# Patient Record
Sex: Male | Born: 1965
Health system: Southern US, Community
[De-identification: ages and names within clinical notes are randomized; demographics above are authoritative.]

## PROBLEM LIST (undated history)

## (undated) DIAGNOSIS — R109 Unspecified abdominal pain: Secondary | ICD-10-CM

## (undated) HISTORY — PX: OTHER SURGICAL HISTORY: SHX169

---

## 2015-07-02 ENCOUNTER — Encounter (HOSPITAL_COMMUNITY): Payer: Self-pay | Admitting: Emergency Medicine

## 2015-07-02 ENCOUNTER — Emergency Department (HOSPITAL_COMMUNITY): Payer: Self-pay

## 2015-07-02 ENCOUNTER — Emergency Department (HOSPITAL_COMMUNITY)
Admission: EM | Admit: 2015-07-02 | Discharge: 2015-07-02 | Disposition: A | Discharge status: Home / Self Care | Payer: Self-pay | Attending: Emergency Medicine | Admitting: Emergency Medicine

## 2015-07-02 DIAGNOSIS — R1032 Left lower quadrant pain: Secondary | ICD-10-CM | POA: Insufficient documentation

## 2015-07-02 DIAGNOSIS — R1031 Right lower quadrant pain: Secondary | ICD-10-CM | POA: Insufficient documentation

## 2015-07-02 DIAGNOSIS — R109 Unspecified abdominal pain: Secondary | ICD-10-CM

## 2015-07-02 DIAGNOSIS — R5383 Other fatigue: Secondary | ICD-10-CM | POA: Insufficient documentation

## 2015-07-02 DIAGNOSIS — R339 Retention of urine, unspecified: Secondary | ICD-10-CM | POA: Insufficient documentation

## 2015-07-02 DIAGNOSIS — F17200 Nicotine dependence, unspecified, uncomplicated: Secondary | ICD-10-CM | POA: Insufficient documentation

## 2015-07-02 LAB — URINALYSIS, ROUTINE W REFLEX MICROSCOPIC
BILIRUBIN URINE: NEGATIVE
GLUCOSE, UA: NEGATIVE mg/dL
HGB URINE DIPSTICK: NEGATIVE
KETONES UR: NEGATIVE mg/dL
Leukocytes, UA: NEGATIVE
NITRITE: NEGATIVE
PH: 5.5 (ref 5.0–8.0)
Protein, ur: NEGATIVE mg/dL
SPECIFIC GRAVITY, URINE: 1.029 (ref 1.005–1.030)

## 2015-07-02 LAB — COMPREHENSIVE METABOLIC PANEL
ALBUMIN: 3.6 g/dL (ref 3.5–5.0)
ALT: 35 U/L (ref 17–63)
AST: 29 U/L (ref 15–41)
Alkaline Phosphatase: 60 U/L (ref 38–126)
Anion gap: 4 — ABNORMAL LOW (ref 5–15)
BILIRUBIN TOTAL: 0.3 mg/dL (ref 0.3–1.2)
BUN: 11 mg/dL (ref 6–20)
CO2: 24 mmol/L (ref 22–32)
Calcium: 8.6 mg/dL — ABNORMAL LOW (ref 8.9–10.3)
Chloride: 110 mmol/L (ref 101–111)
Creatinine, Ser: 1.23 mg/dL (ref 0.61–1.24)
GFR calc Af Amer: >60 mL/min (ref >60)
GFR calc non Af Amer: >60 mL/min (ref >60)
GLUCOSE: 125 mg/dL — AB (ref 65–99)
POTASSIUM: 4.1 mmol/L (ref 3.5–5.1)
SODIUM: 138 mmol/L (ref 135–145)
TOTAL PROTEIN: 6.9 g/dL (ref 6.5–8.1)

## 2015-07-02 LAB — CBC
HEMATOCRIT: 39.2 % (ref 39.0–52.0)
HEMOGLOBIN: 13.3 g/dL (ref 13.0–17.0)
MCH: 28.9 pg (ref 26.0–34.0)
MCHC: 33.9 g/dL (ref 30.0–36.0)
MCV: 85.2 fL (ref 78.0–100.0)
Platelets: 171 10*3/uL (ref 150–400)
RBC: 4.6 MIL/uL (ref 4.22–5.81)
RDW: 14.6 % (ref 11.5–15.5)
WBC: 6.2 10*3/uL (ref 4.0–10.5)

## 2015-07-02 LAB — TSH: TSH: 0.72 u[IU]/mL (ref 0.350–4.500)

## 2015-07-02 LAB — LIPASE, BLOOD: Lipase: 34 U/L (ref 11–51)

## 2015-07-02 MED ORDER — MORPHINE SULFATE (PF) 4 MG/ML IV SOLN
4.0000 mg | Freq: Once | INTRAVENOUS | Status: DC
  Filled 2015-07-02: qty 1

## 2015-07-02 MED ORDER — NAPROXEN 375 MG PO TABS: 375.0000 mg | ORAL_TABLET | Freq: Two times a day (BID) | ORAL | Status: DC

## 2015-07-02 MED ORDER — ONDANSETRON HCL 4 MG/2ML IJ SOLN
4.0000 mg | Freq: Once | INTRAMUSCULAR | Status: AC
  Administered 2015-07-02: 4 mg via INTRAVENOUS
  Filled 2015-07-02: qty 2

## 2015-07-02 MED ORDER — SODIUM CHLORIDE 0.9 % IV BOLUS (SEPSIS)
1000.0000 mL | Freq: Once | INTRAVENOUS | Status: AC
  Administered 2015-07-02: 1000 mL via INTRAVENOUS

## 2015-07-02 MED ORDER — IOPAMIDOL (ISOVUE-300) INJECTION 61%
INTRAVENOUS | Status: AC
  Administered 2015-07-02: 100 mL
  Filled 2015-07-02: qty 100

## 2015-07-02 NOTE — ED Notes (Signed)
Pt states every since May he has trouble going to the bathroom, states he has the urge to go but cant and sits on the toilet to wait for it to come. Pt states he took cranberry juice and drank a lot of water. Pt states the second week of June he started having abdominal pain and ever since May hes felt weaker and weaker. Pt thought he pulled his muscle in his abdomen but the pain is still there. Pain in bilateral lower quadrants and tender to palpation. Pt states ambulating helps with the pain. Pt is in NAD. AAOX4, ambulatory.

## 2015-07-02 NOTE — ED Provider Notes (Signed)
CSN: [id number]     Arrival date & time 07/02/15  1126 History   First MD Initiated Contact with Patient 07/02/15 1617     Chief Complaint  Patient presents with  . Urinary Retention  . Abdominal Pain  . Fatigue   HPI Pt states every since May he has trouble going to the bathroom, states he has the urge to go but cant and sits on the toilet to wait for it to come. Pt states he took cranberry juice and drank a lot of water. Pt states the second week of June he started having abdominal pain and ever since May hes felt weaker and weaker. Pt thought he pulled his muscle in his abdomen but the pain is still there. Pain in bilateral lower quadrants. Pt states ambulating helps with the pain. No testicular pain. He is  Not taking any painhh meds. Did vomit once yesterday after drinking water. Not drinking much today or eating. 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 No fevers, chills. Has all abdominal organs.  No dysuria, no genital discharge.   History reviewed. No pertinent past medical history. Past Surgical History  Procedure Laterality Date  . Knee surgery     No family history on file. Social History  Substance Use Topics  . Smoking status: Current Some Day Smoker  . Smokeless tobacco: None  . Alcohol Use: Yes    Review of Systems  Constitutional: Negative for fever.  Genitourinary: Negative for dysuria, penile swelling and penile pain.  Allergic/Immunologic: Negative for immunocompromised state.  All other systems reviewed and are negative.     Allergies  Vinegar  Home Medications   Prior to Admission medications   Medication Sig Start Date End Date Taking? Authorizing Provider  naproxen (NAPROSYN) 375 MG tablet Take 1 tablet (375 mg total) by mouth 2 (two) times daily. 07/02/15   Sidney AceAlison Charruf Dracen Reigle, MD   BP 116/84 mmHg  Pulse 61  Temp(Src) 98.2 F (36.8 C) (Oral)  Resp 18  SpO2 100% Physical Exam  Constitutional: He appears  well-developed and well-nourished. No distress.  HENT:  Head: Normocephalic and atraumatic.  Left Ear: External ear normal.  Eyes: Conjunctivae are normal. Pupils are equal, round, and reactive to light. Right eye exhibits no discharge. Left eye exhibits no discharge.  Neck: Normal range of motion. Neck supple.  Cardiovascular: Normal rate and regular rhythm.   No murmur heard. Pulmonary/Chest: Effort normal and breath sounds normal. No respiratory distress.  Abdominal: Soft. Bowel sounds are normal. He exhibits no distension and no mass. There is tenderness (bilateral pelvic and suprapubic). There is no rebound. Guarding: mild, involuntary.  Genitourinary: Penis normal. No penile tenderness.  No inguinal  Hernia, no epidydimal tenderness  Musculoskeletal: He exhibits no edema.  Neurological: He is alert.  Skin: Skin is warm. He is not diaphoretic.  Psychiatric: He has a normal mood and affect.     ED Course  Procedures (including critical care time) Labs Review Labs Reviewed  COMPREHENSIVE METABOLIC PANEL - Abnormal; Notable for the following:    Glucose, Bld 125 (*)    Calcium 8.6 (*)    Anion gap 4 (*)    All other components within normal limits  LIPASE, BLOOD  CBC  URINALYSIS, ROUTINE W REFLEX MICROSCOPIC (NOT AT [facility])  TSH    Imaging Review Ct Abdomen Pelvis W Contrast  07/02/2015  CLINICAL DATA:  Pt states every since May he has trouble going to the  bathroom, states he has the urge to go but cant and sits on the toilet to wait for it to come. Pt states he took cranberry juice and drank a lot of water. Abdominal pain. Weakness. EXAM: CT ABDOMEN AND PELVIS WITH CONTRAST TECHNIQUE: Multidetector CT imaging of the abdomen and pelvis was performed using the standard protocol following bolus administration of intravenous contrast. CONTRAST:  ISOVUE-300 IOPAMIDOL (ISOVUE-300) INJECTION 61% COMPARISON:  None. FINDINGS: Lower chest: Lung bases are clear. Hepatobiliary: No  focal hepatic lesion. No biliary duct dilatation. Gallbladder is normal. Common bile duct is normal. Pancreas: Pancreas is normal. No ductal dilatation. No pancreatic inflammation. Spleen: Normal spleen Adrenals/urinary tract: Adrenal glands and kidneys are normal. The ureters and bladder normal. Stomach/Bowel: Stomach, small bowel, appendix, and cecum are normal. The colon and rectosigmoid colon are normal. Vascular/Lymphatic: Abdominal aorta is normal caliber. There is no retroperitoneal or periportal lymphadenopathy. No pelvic lymphadenopathy. Reproductive: Prostate normal all Other: small umbilical hernia measures 23 by 16 mm (image 52, series 20) Musculoskeletal: No aggressive osseous lesion. IMPRESSION: 1. No acute abdominal or pelvic findings. 2. Small umbilical hernia. Electronically Signed   By: Genevive Bi M.D.   On: 07/02/2015 18:27   I have personally reviewed and evaluated these images and lab results as part of my medical decision-making.   EKG Interpretation None      MDM   Final diagnoses:  Abdominal pain, unspecified abdominal location   Doubt ruptured ulcer as abdomen without peritonitis and denies blood in stool or vomit; no significant lipase elevation to suggest acute pancreatitis; doubt bowel obstruction as no significant constipation/hx of SBO/Crohns or other high risk condition. Doubt ischemic bowel as pain not out of proportion to exam. Labs reviewed and not suggestive of hepatic/biliary emergency, AKI, or critical anemia. Pain is not epigastric and patient is not diabetic or with other risk factors for ACS. Doubt MI and patient also denies chest pain or shortness of breath. US bladder: 16cc post-void, doubt retention.  Given significant pain however, obtained CT abdomen and pelvis.  The CT of the abdomen and pelvis did not reveal any acute abnormality. A small umbilical hernia was noted. Labs were unremarkable and urinalysis did not reveal a urinary tract infection.  Suspect pain may be musculoskeletal but recommend follow-up with primary care provider.  Return for severe worsening pain, intractable vomiting, inability to tolerate PO or other concerning sx. FU with pcp in 2 days. Pt verbalized understanding. Pt discharged in good condition.     Sidney Ace, MD 07/02/15 1931  Cathren Laine, MD 07/02/15 [phone]

## 2015-07-02 NOTE — Discharge Instructions (Signed)
The CT of the abdomen and pelvis did not reveal any acute abnormality. A small umbilical hernia was noted. Labs were unremarkable and urinalysis did not reveal a urinary tract infection. Suspect pain may be musculoskeletal but recommend follow-up with primary care provider.  Abdominal Pain, Adult Many things can cause abdominal pain. Usually, abdominal pain is not caused by a disease and will improve without treatment. It can often be observed and treated at home. Your health care provider will do a physical exam and possibly order blood tests and X-rays to help determine the seriousness of your pain. However, in many cases, more time must pass before a clear cause of the pain can be found. Before that point, your health care provider may not know if you need more testing or further treatment. HOME CARE INSTRUCTIONS Monitor your abdominal pain for any changes. The following actions may help to alleviate any discomfort you are experiencing:  Only take over-the-counter or prescription medicines as directed by your health care provider.  Do not take laxatives unless directed to do so by your health care provider.  Try a clear liquid diet (broth, tea, or water) as directed by your health care provider. Slowly move to a bland diet as tolerated. SEEK MEDICAL CARE IF:  You have unexplained abdominal pain.  You have abdominal pain associated with nausea or diarrhea.  You have pain when you urinate or have a bowel movement.  You experience abdominal pain that wakes you in the night.  You have abdominal pain that is worsened or improved by eating food.  You have abdominal pain that is worsened with eating fatty foods.  You have a fever. SEEK IMMEDIATE MEDICAL CARE IF:  Your pain does not go away within 2 hours.  You keep throwing up (vomiting).  Your pain is felt only in portions of the abdomen, such as the right side or the left lower portion of the abdomen.  You pass bloody or black tarry  stools. MAKE SURE YOU:  Understand these instructions.  Will watch your condition.  Will get help right away if you are not doing well or get worse.   This information is not intended to replace advice given to you by your health care provider. Make sure you discuss any questions you have with your health care provider.   Document Released: 10/08/2004 Document Revised: 09/19/2014 Document Reviewed: 09/07/2012 Elsevier Interactive Patient Education [employer]2016 Elsevier Inc.

## 2015-07-02 NOTE — ED Notes (Signed)
Care assumed at time of d/c.

## 2017-04-05 ENCOUNTER — Ambulatory Visit (HOSPITAL_COMMUNITY)
Admission: EM | Admit: 2017-04-05 | Discharge: 2017-04-05 | Disposition: A | Discharge status: Home / Self Care | Payer: Self-pay | Attending: Family Medicine | Admitting: Family Medicine

## 2017-04-05 ENCOUNTER — Encounter (HOSPITAL_COMMUNITY): Payer: Self-pay | Admitting: Family Medicine

## 2017-04-05 DIAGNOSIS — I10 Essential (primary) hypertension: Secondary | ICD-10-CM

## 2017-04-05 NOTE — ED Triage Notes (Signed)
Pt here for episode of work today . He said he was stressed and BP got up to 159/103. Denies passing out or dizziness. Denies chest pain or SOB.

## 2017-04-06 NOTE — ED Provider Notes (Signed)
[facility]   [id number] 04/05/17 Arrival Time: 1133  ASSESSMENT & PLAN:  1. Hypertension, unspecified type    Plans to purchase a BP cuff so that he may begin checking regularly. Plans to est care with PCP. Recommend that he f/u here in a few weeks with BP log and we can discuss.  Will follow up with PCP or here if worsening or failing to improve as anticipated. Reviewed expectations re: course of current medical issues. Questions answered. Outlined signs and symptoms indicating need for more acute intervention. Patient verbalized understanding. After Visit Summary given.   SUBJECTIVE:  Tommas OlpBrian Hruska is a 52 y.o. male who presents with concerns regarding increased blood pressures. He has not been treated for HTN in the past. BP at work today was 159/103. Does not check regularly. 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 Denies symptoms of chest pain, palpations, orthopnea, nocturnal dyspnea, or LE edema.  FH of HTN.  Social History   Tobacco Use  Smoking Status Current Some Day Smoker   ROS: As per HPI.   OBJECTIVE:  Vitals:   04/05/17 1251  BP: (!) 154/96  Pulse: 65  Resp: 18  Temp: 97.8 F (36.6 C)  TempSrc: Oral  SpO2: 100%    General appearance: alert; no distress Eyes: PERRLA; EOMI HENT: normocephalic; atraumatic Neck: supple Lungs: clear to auscultation bilaterally Heart: regular rate and rhythm without murmer Abdomen: soft, non-tender Extremities: no edema; symmetrical with no gross deformities Skin: warm and dry Psychological: alert and cooperative; normal mood and affect  Allergies  Allergen Reactions  . Vinegar [Acetic Acid]     Social History   Socioeconomic History  . Marital status: Single    Spouse name: Not on file  . Number of children: Not on file  . Years of education: Not on file  . Highest education level: Not on file  Occupational History  . Not on file  Social Needs  . Financial  resource strain: Not on file  . Food insecurity:    Worry: Not on file    Inability: Not on file  . Transportation needs:    Medical: Not on file    Non-medical: Not on file  Tobacco Use  . Smoking status: Current Some Day Smoker  Substance and Sexual Activity  . Alcohol use: Yes  . Drug use: Not on file  . Sexual activity: Not on file  Lifestyle  . Physical activity:    Days per week: Not on file    Minutes per session: Not on file  . Stress: Not on file  Relationships  . Social connections:    Talks on phone: Not on file    Gets together: Not on file    Attends religious service: Not on file    Active member of club or organization: Not on file    Attends meetings of clubs or organizations: Not on file    Relationship status: Not on file  . Intimate partner violence:    Fear of current or ex partner: Not on file    Emotionally abused: Not on file    Physically abused: Not on file    Forced sexual activity: Not on file  Other Topics Concern  . Not on file  Social History Narrative  . Not on file    Past Surgical History:  Procedure Laterality Date  . knee surgery        Mardella LaymanHagler, Nhat, MD 04/06/17 1013

## 2017-10-09 ENCOUNTER — Inpatient Hospital Stay (HOSPITAL_COMMUNITY): Payer: Self-pay

## 2017-10-09 ENCOUNTER — Emergency Department (HOSPITAL_COMMUNITY): Payer: Self-pay

## 2017-10-09 ENCOUNTER — Inpatient Hospital Stay (HOSPITAL_COMMUNITY)
Admission: EM | Admit: 2017-10-09 | Discharge: 2017-10-12 | DRG: 872 | Disposition: A | Discharge status: Home / Self Care | Payer: Self-pay | Attending: Internal Medicine | Admitting: Internal Medicine

## 2017-10-09 ENCOUNTER — Encounter (HOSPITAL_COMMUNITY): Payer: Self-pay | Admitting: Emergency Medicine

## 2017-10-09 ENCOUNTER — Other Ambulatory Visit: Payer: Self-pay

## 2017-10-09 DIAGNOSIS — E860 Dehydration: Secondary | ICD-10-CM | POA: Diagnosis present

## 2017-10-09 DIAGNOSIS — N410 Acute prostatitis: Secondary | ICD-10-CM

## 2017-10-09 DIAGNOSIS — N289 Disorder of kidney and ureter, unspecified: Secondary | ICD-10-CM

## 2017-10-09 DIAGNOSIS — N419 Inflammatory disease of prostate, unspecified: Secondary | ICD-10-CM | POA: Diagnosis present

## 2017-10-09 DIAGNOSIS — E871 Hypo-osmolality and hyponatremia: Secondary | ICD-10-CM | POA: Diagnosis present

## 2017-10-09 DIAGNOSIS — N12 Tubulo-interstitial nephritis, not specified as acute or chronic: Secondary | ICD-10-CM | POA: Diagnosis present

## 2017-10-09 DIAGNOSIS — I452 Bifascicular block: Secondary | ICD-10-CM | POA: Diagnosis present

## 2017-10-09 DIAGNOSIS — N179 Acute kidney failure, unspecified: Secondary | ICD-10-CM | POA: Diagnosis present

## 2017-10-09 DIAGNOSIS — R109 Unspecified abdominal pain: Secondary | ICD-10-CM

## 2017-10-09 DIAGNOSIS — E861 Hypovolemia: Secondary | ICD-10-CM | POA: Diagnosis present

## 2017-10-09 DIAGNOSIS — A419 Sepsis, unspecified organism: Principal | ICD-10-CM | POA: Diagnosis present

## 2017-10-09 HISTORY — DX: Unspecified abdominal pain: R10.9

## 2017-10-09 LAB — COMPREHENSIVE METABOLIC PANEL
ALK PHOS: 70 U/L (ref 38–126)
ALT: 31 U/L (ref 0–44)
AST: 21 U/L (ref 15–41)
Albumin: 3.7 g/dL (ref 3.5–5.0)
Anion gap: 9 (ref 5–15)
BUN: 11 mg/dL (ref 6–20)
CHLORIDE: 104 mmol/L (ref 98–111)
CO2: 20 mmol/L — AB (ref 22–32)
CREATININE: 1.37 mg/dL — AB (ref 0.61–1.24)
Calcium: 8.7 mg/dL — ABNORMAL LOW (ref 8.9–10.3)
GFR calc Af Amer: >60 mL/min (ref >60)
GFR calc non Af Amer: 58 mL/min — ABNORMAL LOW (ref >60)
Glucose, Bld: 98 mg/dL (ref 70–99)
Potassium: 3.9 mmol/L (ref 3.5–5.1)
SODIUM: 133 mmol/L — AB (ref 135–145)
Total Bilirubin: 1.4 mg/dL — ABNORMAL HIGH (ref 0.3–1.2)
Total Protein: 7.6 g/dL (ref 6.5–8.1)

## 2017-10-09 LAB — URINALYSIS, ROUTINE W REFLEX MICROSCOPIC
Bacteria, UA: NONE SEEN
Bilirubin Urine: NEGATIVE
Glucose, UA: NEGATIVE mg/dL
Hgb urine dipstick: NEGATIVE
KETONES UR: 20 mg/dL — AB
Nitrite: NEGATIVE
Protein, ur: NEGATIVE mg/dL
SPECIFIC GRAVITY, URINE: 1.036 — AB (ref 1.005–1.030)
pH: 6 (ref 5.0–8.0)

## 2017-10-09 LAB — CBC
HCT: 40.2 % (ref 39.0–52.0)
Hemoglobin: 13.9 g/dL (ref 13.0–17.0)
MCH: 29.7 pg (ref 26.0–34.0)
MCHC: 34.6 g/dL (ref 30.0–36.0)
MCV: 85.9 fL (ref 78.0–100.0)
PLATELETS: 167 10*3/uL (ref 150–400)
RBC: 4.68 MIL/uL (ref 4.22–5.81)
RDW: 13.6 % (ref 11.5–15.5)
WBC: 10.7 10*3/uL — ABNORMAL HIGH (ref 4.0–10.5)

## 2017-10-09 LAB — LIPASE, BLOOD: LIPASE: 28 U/L (ref 11–51)

## 2017-10-09 LAB — I-STAT CG4 LACTIC ACID, ED: LACTIC ACID, VENOUS: 0.75 mmol/L (ref 0.5–1.9)

## 2017-10-09 MED ORDER — ACETAMINOPHEN 325 MG PO TABS: 650.0000 mg | ORAL_TABLET | Freq: Four times a day (QID) | ORAL | Status: DC | PRN

## 2017-10-09 MED ORDER — MORPHINE SULFATE (PF) 4 MG/ML IV SOLN
4.0000 mg | Freq: Once | INTRAVENOUS | Status: DC
  Filled 2017-10-09: qty 1

## 2017-10-09 MED ORDER — CIPROFLOXACIN IN D5W 400 MG/200ML IV SOLN
400.0000 mg | Freq: Once | INTRAVENOUS | Status: AC
  Administered 2017-10-10: 400 mg via INTRAVENOUS
  Filled 2017-10-09: qty 200

## 2017-10-09 MED ORDER — OXYCODONE HCL 5 MG PO TABS
5.0000 mg | ORAL_TABLET | Freq: Once | ORAL | Status: AC
  Administered 2017-10-09: 5 mg via ORAL
  Filled 2017-10-09: qty 1

## 2017-10-09 MED ORDER — SODIUM CHLORIDE 0.9 % IV SOLN: 1.0000 g | INTRAVENOUS | Status: DC

## 2017-10-09 MED ORDER — CIPROFLOXACIN IN D5W 400 MG/200ML IV SOLN: 400.0000 mg | Freq: Once | INTRAVENOUS | Status: DC

## 2017-10-09 MED ORDER — ONDANSETRON HCL 4 MG/2ML IJ SOLN: 4.0000 mg | Freq: Four times a day (QID) | INTRAMUSCULAR | Status: DC | PRN

## 2017-10-09 MED ORDER — CEFTRIAXONE SODIUM 250 MG IJ SOLR: 250.0000 mg | Freq: Once | INTRAMUSCULAR | Status: DC

## 2017-10-09 MED ORDER — ENOXAPARIN SODIUM 40 MG/0.4ML ~~LOC~~ SOLN
40.0000 mg | SUBCUTANEOUS | Status: DC
  Filled 2017-10-09 (×4): qty 0.4

## 2017-10-09 MED ORDER — AZITHROMYCIN 500 MG PO TABS
1000.0000 mg | ORAL_TABLET | Freq: Once | ORAL | Status: AC
  Administered 2017-10-09: 1000 mg via ORAL
  Filled 2017-10-09: qty 2

## 2017-10-09 MED ORDER — SODIUM CHLORIDE 0.9 % IV SOLN
INTRAVENOUS | Status: DC
  Administered 2017-10-09: 22:00:00 via INTRAVENOUS

## 2017-10-09 MED ORDER — SODIUM CHLORIDE 0.9 % IV BOLUS
1000.0000 mL | Freq: Once | INTRAVENOUS | Status: AC
  Administered 2017-10-09: 1000 mL via INTRAVENOUS

## 2017-10-09 MED ORDER — SODIUM CHLORIDE 0.9 % IV SOLN
INTRAVENOUS | Status: AC
  Administered 2017-10-09: 22:00:00 via INTRAVENOUS

## 2017-10-09 MED ORDER — ONDANSETRON HCL 4 MG PO TABS
4.0000 mg | ORAL_TABLET | Freq: Four times a day (QID) | ORAL | Status: DC | PRN
  Administered 2017-10-10 – 2017-10-11 (×2): 4 mg via ORAL
  Filled 2017-10-09 (×2): qty 1

## 2017-10-09 MED ORDER — IOHEXOL 300 MG/ML  SOLN
100.0000 mL | Freq: Once | INTRAMUSCULAR | Status: AC | PRN
  Administered 2017-10-09: 100 mL via INTRAVENOUS

## 2017-10-09 MED ORDER — SODIUM CHLORIDE 0.9 % IV SOLN
1.0000 g | Freq: Once | INTRAVENOUS | Status: AC
  Administered 2017-10-09: 1 g via INTRAVENOUS
  Filled 2017-10-09: qty 10

## 2017-10-09 MED ORDER — CIPROFLOXACIN IN D5W 400 MG/200ML IV SOLN
400.0000 mg | Freq: Two times a day (BID) | INTRAVENOUS | Status: DC
  Administered 2017-10-10 – 2017-10-11 (×3): 400 mg via INTRAVENOUS
  Filled 2017-10-09 (×3): qty 200

## 2017-10-09 MED ORDER — ACETAMINOPHEN 325 MG PO TABS
650.0000 mg | ORAL_TABLET | Freq: Once | ORAL | Status: AC | PRN
  Administered 2017-10-09: 650 mg via ORAL
  Filled 2017-10-09: qty 2

## 2017-10-09 MED ORDER — ACETAMINOPHEN 325 MG PO TABS
650.0000 mg | ORAL_TABLET | Freq: Once | ORAL | Status: AC
  Administered 2017-10-09: 650 mg via ORAL
  Filled 2017-10-09: qty 2

## 2017-10-09 MED ORDER — HYDROCODONE-ACETAMINOPHEN 5-325 MG PO TABS
1.0000 | ORAL_TABLET | ORAL | Status: DC | PRN
  Administered 2017-10-10 – 2017-10-11 (×5): 2 via ORAL
  Filled 2017-10-09 (×6): qty 2

## 2017-10-09 MED ORDER — SODIUM CHLORIDE 0.9% FLUSH
3.0000 mL | Freq: Two times a day (BID) | INTRAVENOUS | Status: DC
  Administered 2017-10-10 – 2017-10-11 (×2): 3 mL via INTRAVENOUS

## 2017-10-09 MED ORDER — LACTATED RINGERS IV SOLN: INTRAVENOUS | Status: DC

## 2017-10-09 MED ORDER — ONDANSETRON HCL 4 MG/2ML IJ SOLN
4.0000 mg | Freq: Once | INTRAMUSCULAR | Status: AC
  Administered 2017-10-09: 4 mg via INTRAVENOUS
  Filled 2017-10-09: qty 2

## 2017-10-09 MED ORDER — ACETAMINOPHEN 650 MG RE SUPP: 650.0000 mg | Freq: Four times a day (QID) | RECTAL | Status: DC | PRN

## 2017-10-09 MED ORDER — SENNOSIDES-DOCUSATE SODIUM 8.6-50 MG PO TABS
1.0000 | ORAL_TABLET | Freq: Every evening | ORAL | Status: DC | PRN
  Administered 2017-10-11: 1 via ORAL
  Filled 2017-10-09: qty 1

## 2017-10-09 NOTE — ED Notes (Signed)
2 RNs assessed potiential sites for blood cultures, unable to find. Phelbotomist attempted stick unsucessfully.

## 2017-10-09 NOTE — ED Provider Notes (Signed)
Physical Exam  BP (!) 146/90 (BP Location: Right Arm)   Pulse 81   Temp (!) 101.8 F (38.8 C) (Oral)   Resp (!) 32   SpO2 100%   Assumed care from [facility], PA-C at 1600. Briefly, the patient is a 52 y.o. male with PMHx of  has no past medical history on file. here with lower abdominal pain.  Patient reports that for the past 24 hours, he began having increasing lower abdominal pain in the suprapubic region, and also in a bandlike pattern.  Patient is also reported increased urinary frequency, going at least once an hour.  Patient denies dysuria, penile discharge, testicular pain or swelling, or testicular erythema.  Patient denies any history of urinary tract infections or recent urologic procedures.  Patient does report that he had a sexually transmitted infection from his current partner in patient was treated at that time, denies any other sexual partners in the interim.  Labs Reviewed  COMPREHENSIVE METABOLIC PANEL - Abnormal; Notable for the following components:      Result Value   Sodium 133 (*)    CO2 20 (*)    Creatinine, Ser 1.37 (*)    Calcium 8.7 (*)    Total Bilirubin 1.4 (*)    GFR calc non Af Amer 58 (*)    All other components within normal limits  CBC - Abnormal; Notable for the following components:   WBC 10.7 (*)    All other components within normal limits  URINALYSIS, ROUTINE W REFLEX MICROSCOPIC - Abnormal; Notable for the following components:   Specific Gravity, Urine 1.036 (*)    Ketones, ur 20 (*)    Leukocytes, UA MODERATE (*)    All other components within normal limits  URINE CULTURE  CULTURE, BLOOD (ROUTINE X 2)  CULTURE, BLOOD (ROUTINE X 2)  LIPASE, BLOOD  HIV ANTIBODY (ROUTINE TESTING W REFLEX)  RPR  GC/CHLAMYDIA PROBE AMP ([place]) NOT AT [facility]    Course of Care:   Physical Exam  Constitutional: He appears well-developed and well-nourished. No distress.  HENT:  Head: Normocephalic and atraumatic.  Mouth/Throat: Oropharynx is clear  and moist.  Eyes: Pupils are equal, round, and reactive to light. Conjunctivae and EOM are normal.  Neck: Normal range of motion. Neck supple.  Cardiovascular: Normal rate, regular rhythm, S1 normal and S2 normal.  No murmur heard. Pulmonary/Chest: Effort normal and breath sounds normal. He has no wheezes. He has no rales.  Abdominal: Soft. He exhibits no distension. There is tenderness in the suprapubic area. There is no guarding.  Genitourinary:  Genitourinary Comments: Prostate exam performed with nurse tech chaperone present.  Patient has normal rectal tone.  Prostate was minimally palpated due to concern for prostatitis, patient was tender on palpation of the prostate.   Musculoskeletal: Normal range of motion. He exhibits no edema or deformity.  Lymphadenopathy:    He has no cervical adenopathy.  Neurological: He is alert.  Cranial nerves grossly intact. Patient moves extremities symmetrically and with good coordination.  Skin: Skin is warm and dry. No rash noted. No erythema.  Psychiatric: He has a normal mood and affect. His behavior is normal. Judgment and thought content normal.  Nursing note and vitals reviewed.   ED Course/Procedures   Clinical Course as of Oct 10 2343  Sat Oct 09, 2017  1552 Getting fluids.   Creatinine(!): 1.37 [AM]  1753 Spoke with pharmacist, who recommended ciprofloxacin and IM ceftriaxone.  This is to cover both Gc, chlamydia,  and urinary pathogens.   [AM]  1837 Spoke with [facility], who will admit patient. Appreciate his involvement in the care of this patient.    [AM]    Clinical Course User Index [AM] Elisha Ponder, PA-C    Procedures  MDM   Patient is nontoxic-appearing, but febrile, 101.8.  Differential diagnosis includes complete urinary tract infection versus prostatitis.  Given the fever and no other localizing symptoms for infection, I do feel that this is coming from urinary source, and patient was tender on palpation of  the prostate.  Blood cultures, GC/CT, urine culture, and lactic ordered.  CT abdomen pelvis is without acute abnormality.  Antibiotic started per pharmacist recommendation for coverage for GC/CT, and urinary pathogens.     Elisha Ponder, PA-C 10/09/17 2345    Margarita Grizzle, MD 10/11/17 [phone]

## 2017-10-09 NOTE — ED Notes (Signed)
Pt aware we need a urine specimen. 

## 2017-10-09 NOTE — ED Notes (Signed)
Pt. Stated he is unable to provide sample at this time. Pt. Stated that he just had some water so may be able to provide sample soon.

## 2017-10-09 NOTE — ED Notes (Signed)
Per dr Antionette Char may start antibiotics with one set of cultures

## 2017-10-09 NOTE — ED Notes (Signed)
When verifying that pt aware that urine specimen was needed, noticed patient shaking, obtained oral T (103.21F), triage RN made aware, pt pulled to A-hallway

## 2017-10-09 NOTE — ED Triage Notes (Signed)
Pt reports abd pain and nausea that began yesterday, also reports body aches and chills.

## 2017-10-09 NOTE — ED Provider Notes (Signed)
[facility] EMERGENCY DEPARTMENT Provider Note   CSN: [id number] Arrival date & time: 10/09/17  1147     History   Chief Complaint Chief Complaint  Patient presents with  . Abdominal Pain    HPI Gabriel Vaughn is a 52 y.o. male.  HPI Patient presents to the emergency department with fever, lower abdominal pain, nausea, body aches and chills.  The patient states this began yesterday afternoon.  Patient states that nothing seems to make the condition better or worse.  The patient states he did not take any medication prior to arrival.  Patient states that he had no cough no nasal congestion, no sore throat no or neck stiffness.  The patient states he has had no headache.  Patient states that he is not been around anyone else that is sick.  The patient denies chest pain, shortness of breath, headache,blurred vision, neck pain, fever, cough, weakness, numbness, dizziness, anorexia, edema, vomiting, diarrhea, rash, back pain, dysuria, hematemesis, bloody stool, near syncope, or syncope.  There are no active problems to display for this patient.   Past Surgical History:  Procedure Laterality Date  . knee surgery          Home Medications    Prior to Admission medications   Medication Sig Start Date End Date Taking? Authorizing Provider  naproxen (NAPROSYN) 375 MG tablet Take 1 tablet (375 mg total) by mouth 2 (two) times daily. 07/02/15   Sidney Ace, MD    Family History No family history on file.  Social History Social History   Tobacco Use  . Smoking status: Current Some Day Smoker  Substance Use Topics  . Alcohol use: Yes  . Drug use: Not on file     Allergies   Vinegar [acetic acid]   Review of Systems Review of Systems All other systems negative except as documented in the HPI. All pertinent positives and negatives as reviewed in the HPI. Physical Exam Updated Vital Signs BP 135/84 (BP Location: Right Arm)   Pulse 89   Temp (!)  103.1 F (39.5 C) (Oral)   Resp 18   SpO2 100%   Physical Exam  Constitutional: He is oriented to person, place, and time. He appears well-developed and well-nourished. No distress.  HENT:  Head: Normocephalic and atraumatic.  Mouth/Throat: Oropharynx is clear and moist.  Eyes: Pupils are equal, round, and reactive to light.  Neck: Normal range of motion. Neck supple.  Cardiovascular: Normal rate, regular rhythm and normal heart sounds. Exam reveals no gallop and no friction rub.  No murmur heard. Pulmonary/Chest: Effort normal and breath sounds normal. No respiratory distress. He has no wheezes.  Abdominal: Soft. Bowel sounds are normal. He exhibits no distension. There is tenderness in the right lower quadrant, suprapubic area and left lower quadrant. There is no rigidity, no rebound, no guarding, no CVA tenderness, no tenderness at McBurney's point and negative Murphy's sign. No hernia.    Neurological: He is alert and oriented to person, place, and time. He exhibits normal muscle tone. Coordination normal.  Skin: Skin is warm and dry. Capillary refill takes less than 2 seconds. No rash noted. No erythema.  Psychiatric: He has a normal mood and affect. His behavior is normal.  Nursing note and vitals reviewed.    ED Treatments / Results  Labs (all labs ordered are listed, but only abnormal results are displayed) Labs Reviewed  COMPREHENSIVE METABOLIC PANEL - Abnormal; Notable for the following components:  Result Value   Sodium 133 (*)    CO2 20 (*)    Creatinine, Ser 1.37 (*)    Calcium 8.7 (*)    Total Bilirubin 1.4 (*)    GFR calc non Af Amer 58 (*)    All other components within normal limits  CBC - Abnormal; Notable for the following components:   WBC 10.7 (*)    All other components within normal limits  LIPASE, BLOOD  URINALYSIS, ROUTINE W REFLEX MICROSCOPIC    EKG None  Radiology No results found.  Procedures Procedures (including critical care  time)  Medications Ordered in ED Medications  sodium chloride 0.9 % bolus 1,000 mL (1,000 mLs Intravenous New Bag/Given 10/09/17 1446)  morphine 4 MG/ML injection 4 mg (4 mg Intravenous Refused 10/09/17 1446)  acetaminophen (TYLENOL) tablet 650 mg (650 mg Oral Given 10/09/17 1350)  ondansetron (ZOFRAN) injection 4 mg (4 mg Intravenous Given 10/09/17 1446)  iohexol (OMNIPAQUE) 300 MG/ML solution 100 mL (100 mLs Intravenous Contrast Given 10/09/17 1514)     Initial Impression / Assessment and Plan / ED Course  I have reviewed the triage vital signs and the nursing notes.  Pertinent labs & imaging results that were available during my care of the patient were reviewed by me and considered in my medical decision making (see chart for details).     Patient will be given IV fluids along with anti-medics and pain control.  He is also to have a CT scan to further evaluate his abdominal discomfort.  Patient is advised the plan thus far and all questions were answered.  Final Clinical Impressions(s) / ED Diagnoses   Final diagnoses:  None    ED Discharge Orders    None       Charlestine Night, PA-C 10/09/17 1545    Margarita Grizzle, MD 10/11/17 [phone]

## 2017-10-09 NOTE — H&P (Signed)
History and Physical    Gabriel Vaughn WUJ:[id number] DOB: 01/23/1965 DOA: 10/09/2017  PCP: Patient, No Pcp Per   Patient coming from: Home   Chief Complaint: Chills, lower abdominal pain, nausea  HPI: Gabriel Vaughn is a 52 y.o. male who denies any significant past medical history, though does not see a physician regularly, now presenting to the emergency department with lower abdominal pain, nausea, general malaise, and chills.  Patient reports that the symptoms developed over the past day or so and have steadily worsened.  He reports dull aching pain to the suprapubic region/lower abdomen, localized, severe at times, and associated with nausea, chills, and malaise.  He denies any shortness of breath, cough, vomiting, diarrhea, or rash.  He notes that he may been exposed to STD.  ED Course: Upon arrival to the ED, patient is found to be febrile to 39.5 C, and with vitals otherwise stable.  EKG features a sinus or ectopic atrial rhythm with incomplete RBBB and LAFB.  Chest x-ray is negative for acute cardiopulmonary disease.  CT the abdomen and pelvis is negative for any significant abnormality.  He reportedly had a tender prostate on exam in the ED.  Chemistry panel is notable for mild hyponatremia and creatinine 1.37, up from 1.23 in 2017.  CBC is notable for mild leukocytosis.  Blood and urine cultures were collected, GC, chlamydia, and HIV testing was sent, and the patient was treated with Rocephin, ciprofloxacin, 2 L normal saline, acetaminophen, and oxycodone in the ED.  He remains hemodynamically stable and will be admitted for ongoing evaluation and management of sepsis suspected secondary to prostatitis.  Review of Systems:  All other systems reviewed and apart from HPI, are negative.  History reviewed. No pertinent past medical history.  Past Surgical History:  Procedure Laterality Date  . knee surgery       reports that he has been smoking. He does not have any smokeless tobacco  history on file. He reports that he drinks alcohol. His drug history is not on file.  Allergies  Allergen Reactions  . Vinegar [Acetic Acid] Swelling    Swelling where contact is made    History reviewed. No pertinent family history.   Prior to Admission medications   Medication Sig Start Date End Date Taking? Authorizing Provider  naproxen (NAPROSYN) 375 MG tablet Take 1 tablet (375 mg total) by mouth 2 (two) times daily. Patient not taking: Reported on 10/09/2017 07/02/15   Sidney Ace, MD    Physical Exam: Vitals:   10/09/17 1742 10/09/17 1800 10/09/17 1951 10/09/17 2025  BP: (!) 146/90 (!) 148/93    Pulse: 81 80    Resp: (!) 32 (!) 24    Temp: (!) 101.8 F (38.8 C)  (!) 101.8 F (38.8 C) (!) 100.9 F (38.3 C)  TempSrc: Oral  Oral Oral  SpO2: 100% 99%       Constitutional: NAD, in apparent discomfort Eyes: PERTLA, lids and conjunctivae normal ENMT: Mucous membranes are moist. Posterior pharynx clear of any exudate or lesions.   Neck: normal, supple, no masses, no thyromegaly Respiratory: clear to auscultation bilaterally, no wheezing, no crackles. Normal respiratory effort.    Cardiovascular: S1 & S2 heard, regular rate and rhythm. No extremity edema.  Abdomen: No distension, soft, suprapubic tenderness. Bowel sounds normal.  Musculoskeletal: no clubbing / cyanosis. No joint deformity upper and lower extremities.    Skin: no significant rashes, lesions, ulcers. Warm, dry, well-perfused. Neurologic: CN 2-12 grossly intact. Sensation intact. Strength 5/5  in all 4 limbs.  Psychiatric: Alert and oriented x 3. Calm, cooperative.    Labs on Admission: I have personally reviewed following labs and imaging studies  CBC: Recent Labs  Lab 10/09/17 1241  WBC 10.7*  HGB 13.9  HCT 40.2  MCV 85.9  PLT 167   Basic Metabolic Panel: Recent Labs  Lab 10/09/17 1241  NA 133*  K 3.9  CL 104  CO2 20*  GLUCOSE 98  BUN 11  CREATININE 1.37*  CALCIUM 8.7*    GFR: CrCl cannot be calculated (Unknown ideal weight.). Liver Function Tests: Recent Labs  Lab 10/09/17 1241  AST 21  ALT 31  ALKPHOS 70  BILITOT 1.4*  PROT 7.6  ALBUMIN 3.7   Recent Labs  Lab 10/09/17 1241  LIPASE 28   No results for input(s): AMMONIA in the last 168 hours. Coagulation Profile: No results for input(s): INR, PROTIME in the last 168 hours. Cardiac Enzymes: No results for input(s): CKTOTAL, CKMB, CKMBINDEX, TROPONINI in the last 168 hours. BNP (last 3 results) No results for input(s): PROBNP in the last 8760 hours. HbA1C: No results for input(s): HGBA1C in the last 72 hours. CBG: No results for input(s): GLUCAP in the last 168 hours. Lipid Profile: No results for input(s): CHOL, HDL, LDLCALC, TRIG, CHOLHDL, LDLDIRECT in the last 72 hours. Thyroid Function Tests: No results for input(s): TSH, T4TOTAL, FREET4, T3FREE, THYROIDAB in the last 72 hours. Anemia Panel: No results for input(s): VITAMINB12, FOLATE, FERRITIN, TIBC, IRON, RETICCTPCT in the last 72 hours. Urine analysis:    Component Value Date/Time   COLORURINE YELLOW 10/09/2017 1608   APPEARANCEUR CLEAR 10/09/2017 1608   LABSPEC 1.036 (H) 10/09/2017 1608   PHURINE 6.0 10/09/2017 1608   GLUCOSEU NEGATIVE 10/09/2017 1608   HGBUR NEGATIVE 10/09/2017 1608   BILIRUBINUR NEGATIVE 10/09/2017 1608   KETONESUR 20 (A) 10/09/2017 1608   PROTEINUR NEGATIVE 10/09/2017 1608   NITRITE NEGATIVE 10/09/2017 1608   LEUKOCYTESUR MODERATE (A) 10/09/2017 1608   Sepsis Labs: @LABRCNTIP (procalcitonin:4,lacticidven:4) )No results found for this or any previous visit (from the past 240 hour(s)).   Radiological Exams on Admission: Dg Chest 2 View  Result Date: 10/09/2017 CLINICAL DATA:  Fever and body aches. EXAM: CHEST - 2 VIEW COMPARISON:  None. FINDINGS: The heart size is borderline. The hila, mediastinum, lungs, and pleura are otherwise unremarkable. IMPRESSION: No active cardiopulmonary disease.  Electronically Signed   By: Gerome Sam III M.D   On: 10/09/2017 19:35   Ct Abdomen Pelvis W Contrast  Result Date: 10/09/2017 CLINICAL DATA:  Acute generalized abdominal pain. EXAM: CT ABDOMEN AND PELVIS WITH CONTRAST TECHNIQUE: Multidetector CT imaging of the abdomen and pelvis was performed using the standard protocol following bolus administration of intravenous contrast. CONTRAST:  OMNIPAQUE IOHEXOL 300 MG/ML  SOLN COMPARISON:  CT scan of July 02, 2015. FINDINGS: Lower chest: Minimal left posterior basilar subsegmental atelectasis is noted. Hepatobiliary: No focal liver abnormality is seen. No gallstones, gallbladder wall thickening, or biliary dilatation. Pancreas: Unremarkable. No pancreatic ductal dilatation or surrounding inflammatory changes. Spleen: Normal in size without focal abnormality. Adrenals/Urinary Tract: Adrenal glands are unremarkable. Kidneys are normal, without renal calculi, focal lesion, or hydronephrosis. Bladder is unremarkable. Stomach/Bowel: Stomach is within normal limits. Appendix appears normal. No evidence of bowel wall thickening, distention, or inflammatory changes. Vascular/Lymphatic: No significant vascular findings are present. No enlarged abdominal or pelvic lymph nodes. Reproductive: Prostate is unremarkable. Other: Small fat containing periumbilical hernia is noted. No abnormal fluid collection is noted. Musculoskeletal:  No acute or significant osseous findings. IMPRESSION: No significant abnormality seen in the abdomen or pelvis. Electronically Signed   By: Lupita Raider, M.D.   On: 10/09/2017 16:02    EKG: Independently reviewed. Sinus or ectopic atrial rhythm, RBBB, LAFB.   Assessment/Plan   1. Sepsis suspected secondary to prostatitis  - Presents with lower abdominal pain, nausea, and chills  - Found to have fever, leukocytosis, neg CXR, and tender prostate on exam in ED  - Blood and urine cultures were collected in ED, GC/chlamydia ordered, 2  liters NS given, and he was started on empiric Rocephin and ciprofloxacin for suspected prostatitis, possible recurrent STD  - Per discussion with pharmacist, will give 1 g azithromycin, single-dose Rocephin, then continue Cipro for possible prostatitis  - Continue IVF hydration, and follow cultures and clinical course   2. Mild renal insufficiency  - SCr is 1.37 on admission, slightly up from priors  - He is hypovolemic on admission in setting of sepsis and has been fluid-resuscitated  - Repeat chem panel in am    3. Hyponatremia  - Serum sodium is 133 in setting of hypovolemia  - Treated with 2 liters NS in ED and will be continued on NS infusion over night  - Repeat chem panel in am     DVT prophylaxis: Lovenox Code Status: Full  Family Communication: Discussed with patient  Consults called: None Admission status: Inpatient     Briscoe Deutscher, MD [facility] Pager [phone]  If 7PM-7AM, please contact night-coverage [email or web address] Password [facility]  10/09/2017, 8:44 PM

## 2017-10-10 LAB — BASIC METABOLIC PANEL
Anion gap: 10 (ref 5–15)
BUN: 11 mg/dL (ref 6–20)
CHLORIDE: 107 mmol/L (ref 98–111)
CO2: 17 mmol/L — AB (ref 22–32)
CREATININE: 1.26 mg/dL — AB (ref 0.61–1.24)
Calcium: 7.7 mg/dL — ABNORMAL LOW (ref 8.9–10.3)
GFR calc Af Amer: >60 mL/min (ref >60)
GFR calc non Af Amer: >60 mL/min (ref >60)
Glucose, Bld: 99 mg/dL (ref 70–99)
Potassium: 3.9 mmol/L (ref 3.5–5.1)
SODIUM: 134 mmol/L — AB (ref 135–145)

## 2017-10-10 LAB — CBC WITH DIFFERENTIAL/PLATELET
ABS IMMATURE GRANULOCYTES: 0 10*3/uL (ref 0.0–0.1)
BASOS ABS: 0 10*3/uL (ref 0.0–0.1)
Basophils Relative: 0 %
EOS PCT: 0 %
Eosinophils Absolute: 0 10*3/uL (ref 0.0–0.7)
HCT: 36 % — ABNORMAL LOW (ref 39.0–52.0)
Hemoglobin: 12.6 g/dL — ABNORMAL LOW (ref 13.0–17.0)
Immature Granulocytes: 0 %
LYMPHS ABS: 1.1 10*3/uL (ref 0.7–4.0)
Lymphocytes Relative: 13 %
MCH: 30.4 pg (ref 26.0–34.0)
MCHC: 35 g/dL (ref 30.0–36.0)
MCV: 86.7 fL (ref 78.0–100.0)
MONOS PCT: 13 %
Monocytes Absolute: 1.1 10*3/uL — ABNORMAL HIGH (ref 0.1–1.0)
Neutro Abs: 6.5 10*3/uL (ref 1.7–7.7)
Neutrophils Relative %: 74 %
Platelets: 122 10*3/uL — ABNORMAL LOW (ref 150–400)
RBC: 4.15 MIL/uL — AB (ref 4.22–5.81)
RDW: 13.7 % (ref 11.5–15.5)
WBC: 8.8 10*3/uL (ref 4.0–10.5)

## 2017-10-10 LAB — PROCALCITONIN: PROCALCITONIN: 0.2 ng/mL

## 2017-10-10 LAB — RPR: RPR: NONREACTIVE

## 2017-10-10 LAB — MRSA PCR SCREENING: MRSA BY PCR: NEGATIVE

## 2017-10-10 LAB — HIV ANTIBODY (ROUTINE TESTING W REFLEX): HIV SCREEN 4TH GENERATION: NONREACTIVE

## 2017-10-10 MED ORDER — SODIUM CHLORIDE 0.9 % IV SOLN
INTRAVENOUS | Status: DC
  Administered 2017-10-10 (×3): via INTRAVENOUS

## 2017-10-10 MED ORDER — SODIUM CHLORIDE 0.9 % IV SOLN
1.0000 g | INTRAVENOUS | Status: DC
  Administered 2017-10-10 – 2017-10-11 (×2): 1 g via INTRAVENOUS
  Filled 2017-10-10 (×2): qty 10

## 2017-10-10 NOTE — Progress Notes (Signed)
@IPLOG @        PROGRESS NOTE                                                                                                                                                                                                             Patient Demographics:    Gabriel Vaughn, is a 52 y.o. male, DOB - 06-13-1965, UYQ:[id number]  Admit date - 10/09/2017   Admitting Physician Briscoe Deutscher, MD  Outpatient Primary MD for the patient is Patient, No Pcp Per  LOS - 1  Chief Complaint  Patient presents with  . Abdominal Pain       Brief Narrative  Gabriel Vaughn is a 52 y.o. male who denies any significant past medical history, though does not see a physician regularly, now presenting to the emergency department with lower abdominal pain, nausea, general malaise, and chills.  Patient reports that the symptoms developed over the past day or so and have steadily worsened.  He reports dull aching pain to the suprapubic region/lower abdomen, localized, severe at times, and associated with nausea, chills, and malaise.  He denies any shortness of breath, cough, vomiting, diarrhea, or rash.  He notes that he may been exposed to STD, in the ER he had a tender prostate on exam, he had sepsis, CT abdomen pelvis was negative when he was admitted.   Subjective:    Gabriel Vaughn today has, No headache, No chest pain, mild suprapubic abdominal pain - No Nausea, No new weakness tingling or numbness, No Cough - SOB.    Assessment  & Plan :     1.  Sepsis likely from early prostatitis.  CT unremarkable, recent past history of GC chlamydia infection, no further exposure to new sexual partners, RPR and HIV negative, GC chlamydia this admission pending.  For now on Rocephin and Cipro with some improvement.  Currently no leukocytosis but still low-grade fever, continue treatment follow cultures follow GC chlamydia.  Continue gentle hydration.  Sepsis physiology seems to have resolved.  He appears nontoxic.  Note he  received 1 g of azithromycin for possible STD in the ER itself.   2.  Mild ARF and hyponatremia - due to #1 above with some dehydration.  Improved with hydration.  Continue gentle IV fluids and monitor renal function.  Does use naproxen at home as needed.  Hold for now.      Family Communication  :  None  Code Status :  Full  Disposition Plan  :  Med  Consults  :  None  Procedures  :    CT - No significant abnormality seen in the abdomen or pelvis.  DVT Prophylaxis  :  Lovenox   Lab Results  Component Value Date   PLT 122 (L) 10/10/2017    Diet :  Diet Order            Diet regular Room service appropriate? Yes; Fluid consistency: Thin  Diet effective now               Inpatient Medications Scheduled Meds: . enoxaparin (LOVENOX) injection  40 mg Subcutaneous Q24H  . sodium chloride flush  3 mL Intravenous Q12H   Continuous Infusions: . sodium chloride 10 mL/hr at 10/09/17 2148  . sodium chloride    . cefTRIAXone (ROCEPHIN)  IV    . ciprofloxacin 400 mg (10/10/17 1007)   PRN Meds:.acetaminophen **OR** acetaminophen, HYDROcodone-acetaminophen, ondansetron **OR** ondansetron (ZOFRAN) IV, senna-docusate  Antibiotics  :   Anti-infectives (From admission, onward)   Start     Dose/Rate Route Frequency Ordered Stop   10/10/17 1045  cefTRIAXone (ROCEPHIN) 1 g in sodium chloride 0.9 % 100 mL IVPB     1 g 200 mL/hr over 30 Minutes Intravenous Every 24 hours 10/10/17 1037     10/10/17 1000  ciprofloxacin (CIPRO) IVPB 400 mg     400 mg 200 mL/hr over 60 Minutes Intravenous Every 12 hours 10/09/17 2104     10/09/17 2130  cefTRIAXone (ROCEPHIN) 1 g in sodium chloride 0.9 % 100 mL IVPB     1 g 200 mL/hr over 30 Minutes Intravenous  Once 10/09/17 2103 10/09/17 2212   10/09/17 2130  azithromycin (ZITHROMAX) tablet 1,000 mg     1,000 mg Oral  Once 10/09/17 2103 10/09/17 2137   10/09/17 2030  ciprofloxacin (CIPRO) IVPB 400 mg     400 mg 200 mL/hr over 60 Minutes  Intravenous  Once 10/09/17 2025 10/10/17 0111   10/09/17 2000  cefTRIAXone (ROCEPHIN) 1 g in sodium chloride 0.9 % 100 mL IVPB  Status:  Discontinued     1 g 200 mL/hr over 30 Minutes Intravenous Every 24 hours 10/09/17 1950 10/09/17 2103   10/09/17 1800  ciprofloxacin (CIPRO) IVPB 400 mg  Status:  Discontinued     400 mg 200 mL/hr over 60 Minutes Intravenous  Once 10/09/17 1754 10/09/17 2024   10/09/17 1800  cefTRIAXone (ROCEPHIN) injection 250 mg  Status:  Discontinued     250 mg Intramuscular  Once 10/09/17 1754 10/09/17 1950          Objective:   Vitals:   10/09/17 2025 10/09/17 2040 10/09/17 2049 10/10/17 0530  BP:  124/79 123/78   Pulse:   85   Resp:   (!) 21   Temp: (!) 100.9 F (38.3 C)   (!) 103 F (39.4 C)  TempSrc: Oral     SpO2:   100%     Wt Readings from Last 3 Encounters:  No data found for Wt     Intake/Output Summary (Last 24 hours) at 10/10/2017 1038 Last data filed at 10/10/2017 0549 Gross per 24 hour  Intake 1000 ml  Output 400 ml  Net 600 ml     Physical Exam  Awake Alert, Oriented X 3, No new F.N deficits, Normal affect [place].AT,PERRAL Supple Neck,No JVD, No cervical lymphadenopathy appriciated.  Symmetrical Chest wall movement, Good air movement bilaterally, CTAB RRR,No Gallops,Rubs or new Murmurs, No Parasternal Heave +ve B.Sounds, Abd Soft, mild suprapubic tenderness, No organomegaly appriciated, No  rebound - guarding or rigidity. No Cyanosis, Clubbing or edema, No new Rash or bruise      Data Review:    CBC Recent Labs  Lab 10/09/17 1241 10/10/17 0521  WBC 10.7* 8.8  HGB 13.9 12.6*  HCT 40.2 36.0*  PLT 167 122*  MCV 85.9 86.7  MCH 29.7 30.4  MCHC 34.6 35.0  RDW 13.6 13.7  LYMPHSABS  --  1.1  MONOABS  --  1.1*  EOSABS  --  0.0  BASOSABS  --  0.0    Chemistries  Recent Labs  Lab 10/09/17 1241 10/10/17 0521  NA 133* 134*  K 3.9 3.9  CL 104 107  CO2 20* 17*  GLUCOSE 98 99  BUN 11 11  CREATININE 1.37* 1.26*   CALCIUM 8.7* 7.7*  AST 21  --   ALT 31  --   ALKPHOS 70  --   BILITOT 1.4*  --    ------------------------------------------------------------------------------------------------------------------ No results for input(s): CHOL, HDL, LDLCALC, TRIG, CHOLHDL, LDLDIRECT in the last 72 hours.  No results found for: HGBA1C ------------------------------------------------------------------------------------------------------------------ No results for input(s): TSH, T4TOTAL, T3FREE, THYROIDAB in the last 72 hours.  Invalid input(s): FREET3 ------------------------------------------------------------------------------------------------------------------ No results for input(s): VITAMINB12, FOLATE, FERRITIN, TIBC, IRON, RETICCTPCT in the last 72 hours.  Coagulation profile No results for input(s): INR, PROTIME in the last 168 hours.  No results for input(s): DDIMER in the last 72 hours.  Cardiac Enzymes No results for input(s): CKMB, TROPONINI, MYOGLOBIN in the last 168 hours.  Invalid input(s): CK ------------------------------------------------------------------------------------------------------------------ No results found for: BNP  Micro Results Recent Results (from the past 240 hour(s))  MRSA PCR Screening     Status: None   Collection Time: 10/09/17  9:17 PM  Result Value Ref Range Status   MRSA by PCR NEGATIVE NEGATIVE Final    Comment:        The GeneXpert MRSA Assay (FDA approved for NASAL specimens only), is one component of a comprehensive MRSA colonization surveillance program. It is not intended to diagnose MRSA infection nor to guide or monitor treatment for MRSA infections. Performed at [facility], [address], [place], [place] [address]     Radiology Reports Dg Chest 2 View  Result Date: 10/09/2017 CLINICAL DATA:  Fever and body aches. EXAM: CHEST - 2 VIEW COMPARISON:  None. FINDINGS: The heart size is borderline. The hila, mediastinum, lungs,  and pleura are otherwise unremarkable. IMPRESSION: No active cardiopulmonary disease. Electronically Signed   By: Gerome Sam III M.D   On: 10/09/2017 19:35   Ct Abdomen Pelvis W Contrast  Result Date: 10/09/2017 CLINICAL DATA:  Acute generalized abdominal pain. EXAM: CT ABDOMEN AND PELVIS WITH CONTRAST TECHNIQUE: Multidetector CT imaging of the abdomen and pelvis was performed using the standard protocol following bolus administration of intravenous contrast. CONTRAST:  OMNIPAQUE IOHEXOL 300 MG/ML  SOLN COMPARISON:  CT scan of July 02, 2015. FINDINGS: Lower chest: Minimal left posterior basilar subsegmental atelectasis is noted. Hepatobiliary: No focal liver abnormality is seen. No gallstones, gallbladder wall thickening, or biliary dilatation. Pancreas: Unremarkable. No pancreatic ductal dilatation or surrounding inflammatory changes. Spleen: Normal in size without focal abnormality. Adrenals/Urinary Tract: Adrenal glands are unremarkable. Kidneys are normal, without renal calculi, focal lesion, or hydronephrosis. Bladder is unremarkable. Stomach/Bowel: Stomach is within normal limits. Appendix appears normal. No evidence of bowel wall thickening, distention, or inflammatory changes. Vascular/Lymphatic: No significant vascular findings are present. No enlarged abdominal or pelvic lymph nodes. Reproductive: Prostate is unremarkable. Other: Small  fat containing periumbilical hernia is noted. No abnormal fluid collection is noted. Musculoskeletal: No acute or significant osseous findings. IMPRESSION: No significant abnormality seen in the abdomen or pelvis. Electronically Signed   By: Lupita Raider, M.D.   On: 10/09/2017 16:02    Time Spent in minutes  30   Susa Raring M.D on 10/10/2017 at 10:38 AM  To page go to [email or web address] - password [email or web address]

## 2017-10-11 ENCOUNTER — Inpatient Hospital Stay (HOSPITAL_COMMUNITY): Payer: Self-pay

## 2017-10-11 DIAGNOSIS — Z9889 Other specified postprocedural states: Secondary | ICD-10-CM

## 2017-10-11 DIAGNOSIS — M79609 Pain in unspecified limb: Secondary | ICD-10-CM

## 2017-10-11 LAB — COMPREHENSIVE METABOLIC PANEL
ALBUMIN: 2.8 g/dL — AB (ref 3.5–5.0)
ALT: 61 U/L — ABNORMAL HIGH (ref 0–44)
ANION GAP: 7 (ref 5–15)
AST: 44 U/L — ABNORMAL HIGH (ref 15–41)
Alkaline Phosphatase: 55 U/L (ref 38–126)
BILIRUBIN TOTAL: 0.7 mg/dL (ref 0.3–1.2)
BUN: 9 mg/dL (ref 6–20)
CO2: 21 mmol/L — AB (ref 22–32)
Calcium: 8 mg/dL — ABNORMAL LOW (ref 8.9–10.3)
Chloride: 109 mmol/L (ref 98–111)
Creatinine, Ser: 1.18 mg/dL (ref 0.61–1.24)
GFR calc non Af Amer: >60 mL/min (ref >60)
Glucose, Bld: 98 mg/dL (ref 70–99)
POTASSIUM: 3.8 mmol/L (ref 3.5–5.1)
SODIUM: 137 mmol/L (ref 135–145)
TOTAL PROTEIN: 6.2 g/dL — AB (ref 6.5–8.1)

## 2017-10-11 LAB — C-REACTIVE PROTEIN: CRP: 12 mg/dL — AB (ref <1.0)

## 2017-10-11 LAB — MAGNESIUM: Magnesium: 1.9 mg/dL (ref 1.7–2.4)

## 2017-10-11 LAB — CBC
HCT: 36.3 % — ABNORMAL LOW (ref 39.0–52.0)
Hemoglobin: 12.3 g/dL — ABNORMAL LOW (ref 13.0–17.0)
MCH: 29 pg (ref 26.0–34.0)
MCHC: 33.9 g/dL (ref 30.0–36.0)
MCV: 85.6 fL (ref 78.0–100.0)
Platelets: 132 10*3/uL — ABNORMAL LOW (ref 150–400)
RBC: 4.24 MIL/uL (ref 4.22–5.81)
RDW: 13.6 % (ref 11.5–15.5)
WBC: 7.3 10*3/uL (ref 4.0–10.5)

## 2017-10-11 LAB — SEDIMENTATION RATE: SED RATE: 62 mm/h — AB (ref 0–16)

## 2017-10-11 LAB — URINE CULTURE

## 2017-10-11 LAB — PROCALCITONIN: PROCALCITONIN: 0.15 ng/mL

## 2017-10-11 MED ORDER — SODIUM CHLORIDE 0.9 % IV SOLN
INTRAVENOUS | Status: AC
  Administered 2017-10-11 (×2): via INTRAVENOUS

## 2017-10-11 MED ORDER — IOPAMIDOL (ISOVUE-300) INJECTION 61%
INTRAVENOUS | Status: AC
  Administered 2017-10-11: 100 mL via INTRAVENOUS
  Filled 2017-10-11: qty 50

## 2017-10-11 MED ORDER — POLYETHYLENE GLYCOL 3350 17 G PO PACK
17.0000 g | PACK | Freq: Two times a day (BID) | ORAL | Status: DC
  Administered 2017-10-11: 17 g via ORAL
  Filled 2017-10-11 (×3): qty 1

## 2017-10-11 MED ORDER — METRONIDAZOLE 500 MG PO TABS
500.0000 mg | ORAL_TABLET | Freq: Three times a day (TID) | ORAL | Status: DC
  Administered 2017-10-11 (×2): 500 mg via ORAL
  Filled 2017-10-11 (×4): qty 1

## 2017-10-11 MED ORDER — BISACODYL 10 MG RE SUPP
10.0000 mg | Freq: Once | RECTAL | Status: DC
  Filled 2017-10-11: qty 1

## 2017-10-11 NOTE — Plan of Care (Signed)
  Problem: Activity: Goal: Risk for activity intolerance will decrease Outcome: Progressing   Problem: Nutrition: Goal: Adequate nutrition will be maintained Outcome: Progressing   

## 2017-10-11 NOTE — Progress Notes (Signed)
Call received from C. Bodenheimer, NP stating to continue monitoring patient. P.J. Henderson Newcomer, RN

## 2017-10-11 NOTE — Progress Notes (Signed)
Preliminary notes--Bilateral lower extremities venous duplex exam completed. Negative for DVT.  Hongying Richardson Dopp (RDMS RVT) 10/11/17 5:52 PM

## 2017-10-11 NOTE — Progress Notes (Signed)
_0 @        PROGRESS NOTE                                                                                                                                                                                                             Patient Demographics:    Gabriel Vaughn, is a 52 y.o. male, DOB - [birth year], MVE:[id number]  Admit date - 10/09/2017   Admitting Physician Vianne Bulls, MD  Outpatient Primary MD for the patient is Patient, No Pcp Per  LOS - 2  Chief Complaint  Patient presents with  . Abdominal Pain       Brief Narrative  Gabriel Vaughn is a 52 y.o. male who denies any significant past medical history, though does not see a physician regularly, now presenting to the emergency department with lower abdominal pain, nausea, general malaise, and chills.  Patient reports that the symptoms developed over the past day or so and have steadily worsened.  He reports dull aching pain to the suprapubic region/lower abdomen, localized, severe at times, and associated with nausea, chills, and malaise.  He denies any shortness of breath, cough, vomiting, diarrhea, or rash.  He notes that he may been exposed to STD, in the ER he had a tender prostate on exam, he had sepsis, CT abdomen pelvis was negative when he was admitted.   Subjective:   Patient in bed, appears comfortable, denies any headache, no fever, no chest pain or pressure, no shortness of breath , no abdominal pain. No focal weakness. Feels feverish this am.   Assessment  & Plan :     1.  Sepsis likely from ? early prostatitis.  UA unremarkable, urine culture inconclusive, CT scan done with IV contrast only in the ER was unremarkable, RPR and HIV are negative, GC chlamydia cytology still pending, he has been on high-dose IV Rocephin and Cipro, clinically looks better no leukocytosis but still running high fevers.  Will repeat CT scan abdomen pelvis with oral and IV contrast as he still has some suprapubic pain and high  fevers.  Will change antibiotics to IV Rocephin and oral Flagyl on 10/11/2017 and stop Cipro.  He has already received 1 g of azithromycin in the ER for questionable STDs, he has had recent STD.  Overall he appears nontoxic.  Continue gentle hydration.  Will also check ESR, CRP along with lower extremity venous duplex.  His Lactate & procalcitonin have been unremarkable so far.  2.  Mild ARF and hyponatremia - due to #1 above  with some dehydration.  Resolved after IV fluids.    Family Communication  :  None  Code Status :  Full  Disposition Plan  :  Med  Consults  :  None  Procedures  :    CT - No significant abnormality seen in the abdomen or pelvis.  DVT Prophylaxis  :  Lovenox   Lab Results  Component Value Date   PLT 132 (L) 10/11/2017    Diet :  Diet Order            Diet regular Room service appropriate? Yes; Fluid consistency: Thin  Diet effective now               Inpatient Medications Scheduled Meds: . bisacodyl  10 mg Rectal Once  . enoxaparin (LOVENOX) injection  40 mg Subcutaneous Q24H  . metroNIDAZOLE  500 mg Oral Q8H  . polyethylene glycol  17 g Oral BID  . sodium chloride flush  3 mL Intravenous Q12H   Continuous Infusions: . sodium chloride    . cefTRIAXone (ROCEPHIN)  IV 1 g (10/10/17 2158)   PRN Meds:.acetaminophen **OR** acetaminophen, HYDROcodone-acetaminophen, ondansetron **OR** ondansetron (ZOFRAN) IV, senna-docusate  Antibiotics  :   Anti-infectives (From admission, onward)   Start     Dose/Rate Route Frequency Ordered Stop   10/11/17 1015  metroNIDAZOLE (FLAGYL) tablet 500 mg     500 mg Oral Every 8 hours 10/11/17 1001     10/10/17 2100  cefTRIAXone (ROCEPHIN) 1 g in sodium chloride 0.9 % 100 mL IVPB     1 g 200 mL/hr over 30 Minutes Intravenous Every 24 hours 10/10/17 1037     10/10/17 1000  ciprofloxacin (CIPRO) IVPB 400 mg  Status:  Discontinued     400 mg 200 mL/hr over 60 Minutes Intravenous Every 12 hours 10/09/17 2104 10/11/17  0955   10/09/17 2130  cefTRIAXone (ROCEPHIN) 1 g in sodium chloride 0.9 % 100 mL IVPB     1 g 200 mL/hr over 30 Minutes Intravenous  Once 10/09/17 2103 10/09/17 2212   10/09/17 2130  azithromycin (ZITHROMAX) tablet 1,000 mg     1,000 mg Oral  Once 10/09/17 2103 10/09/17 2137   10/09/17 2030  ciprofloxacin (CIPRO) IVPB 400 mg     400 mg 200 mL/hr over 60 Minutes Intravenous  Once 10/09/17 2025 10/10/17 0111   10/09/17 2000  cefTRIAXone (ROCEPHIN) 1 g in sodium chloride 0.9 % 100 mL IVPB  Status:  Discontinued     1 g 200 mL/hr over 30 Minutes Intravenous Every 24 hours 10/09/17 1950 10/09/17 2103   10/09/17 1800  ciprofloxacin (CIPRO) IVPB 400 mg  Status:  Discontinued     400 mg 200 mL/hr over 60 Minutes Intravenous  Once 10/09/17 1754 10/09/17 2024   10/09/17 1800  cefTRIAXone (ROCEPHIN) injection 250 mg  Status:  Discontinued     250 mg Intramuscular  Once 10/09/17 1754 10/09/17 1950          Objective:   Vitals:   10/10/17 1801 10/10/17 2324 10/11/17 0020 10/11/17 0430  BP:  137/79  117/64  Pulse:  77  (!) 57  Resp:    19  Temp: 99.8 F (37.7 C) (!) 103 F (39.4 C) (!) 100.5 F (38.1 C) 99.7 F (37.6 C)  TempSrc: Oral Oral Oral Oral  SpO2:  98%  99%    Wt Readings from Last 3 Encounters:  No data found for Wt     Intake/Output Summary (Last 24 hours)  at 10/11/2017 1004 Last data filed at 10/11/2017 6468 Gross per 24 hour  Intake 1534.35 ml  Output 1675 ml  Net -140.65 ml     Physical Exam  Awake Alert, Oriented X 3, No new F.N deficits, Normal affect [place].AT,PERRAL Supple Neck,No JVD, No cervical lymphadenopathy appriciated.  Symmetrical Chest wall movement, Good air movement bilaterally, CTAB RRR,No Gallops, Rubs or new Murmurs, No Parasternal Heave +ve B.Sounds, Abd Soft, Mild suprapubic tenderness, No organomegaly appriciated, No rebound - guarding or rigidity. No Cyanosis, Clubbing or edema, No new Rash or bruise     Data Review:    CBC Recent  Labs  Lab 10/09/17 1241 10/10/17 0521 10/11/17 0538  WBC 10.7* 8.8 7.3  HGB 13.9 12.6* 12.3*  HCT 40.2 36.0* 36.3*  PLT 167 122* 132*  MCV 85.9 86.7 85.6  MCH 29.7 30.4 29.0  MCHC 34.6 35.0 33.9  RDW 13.6 13.7 13.6  LYMPHSABS  --  1.1  --   MONOABS  --  1.1*  --   EOSABS  --  0.0  --   BASOSABS  --  0.0  --     Chemistries  Recent Labs  Lab 10/09/17 1241 10/10/17 0521 10/11/17 0538  NA 133* 134* 137  K 3.9 3.9 3.8  CL 104 107 109  CO2 20* 17* 21*  GLUCOSE 98 99 98  BUN _0 CREATININE 1.37* 1.26* 1.18  CALCIUM 8.7* 7.7* 8.0*  MG  --   --  1.9  AST 21  --  44*  ALT 31  --  61*  ALKPHOS 70  --  55  BILITOT 1.4*  --  0.7   ------------------------------------------------------------------------------------------------------------------ No results for input(s): CHOL, HDL, LDLCALC, TRIG, CHOLHDL, LDLDIRECT in the last 72 hours.  No results found for: HGBA1C ------------------------------------------------------------------------------------------------------------------ No results for input(s): TSH, T4TOTAL, T3FREE, THYROIDAB in the last 72 hours.  Invalid input(s): FREET3 ------------------------------------------------------------------------------------------------------------------ No results for input(s): VITAMINB12, FOLATE, FERRITIN, TIBC, IRON, RETICCTPCT in the last 72 hours.  Coagulation profile No results for input(s): INR, PROTIME in the last 168 hours.  No results for input(s): DDIMER in the last 72 hours.  Cardiac Enzymes No results for input(s): CKMB, TROPONINI, MYOGLOBIN in the last 168 hours.  Invalid input(s): CK ------------------------------------------------------------------------------------------------------------------ No results found for: BNP  Micro Results Recent Results (from the past 240 hour(s))  Urine culture     Status: Abnormal   Collection Time: 10/09/17  4:59 PM  Result Value Ref Range Status   Specimen Description  URINE, RANDOM  Final   Special Requests   Final    NONE Performed at [facility], [address], [place], [place] [address]    Culture MULTIPLE SPECIES PRESENT, SUGGEST RECOLLECTION (A)  Final   Report Status 10/11/2017 FINAL  Final  Culture, blood (routine x 2)     Status: None (Preliminary result)   Collection Time: 10/09/17  7:30 PM  Result Value Ref Range Status   Specimen Description BLOOD RIGHT ARM  Final   Special Requests   Final    BOTTLES DRAWN AEROBIC AND ANAEROBIC Blood Culture adequate volume   Culture   Final    NO GROWTH < 24 HOURS Performed at [facility], [place] [address] [place],  [address]    Report Status PENDING  Incomplete  Culture, blood (routine x 2)     Status: None (Preliminary result)   Collection Time: 10/09/17  7:45 PM  Result Value Ref Range Status  Specimen Description BLOOD RIGHT HAND  Final   Special Requests   Final    BOTTLES DRAWN AEROBIC AND ANAEROBIC Blood Culture adequate volume   Culture   Final    NO GROWTH < 24 HOURS Performed at [facility], [place] [address], [place], [place] [address]    Report Status PENDING  Incomplete  MRSA PCR Screening     Status: None   Collection Time: 10/09/17  9:17 PM  Result Value Ref Range Status   MRSA by PCR NEGATIVE NEGATIVE Final    Comment:        The GeneXpert MRSA Assay (FDA approved for NASAL specimens only), is one component of a comprehensive MRSA colonization surveillance program. It is not intended to diagnose MRSA infection nor to guide or monitor treatment for MRSA infections. Performed at [facility], [place] [address] [place], [place] [address]     Radiology Reports Dg Chest 2 View  Result Date: 10/09/2017 CLINICAL DATA:  Fever and body aches. EXAM: CHEST - 2 VIEW COMPARISON:  None. FINDINGS: The heart size is borderline. The hila, mediastinum, lungs, and pleura are otherwise unremarkable. IMPRESSION: No active cardiopulmonary disease.  Electronically Signed   By: Dorise Bullion III M.D   On: 10/09/2017 19:35   Ct Abdomen Pelvis W Contrast  Result Date: 10/09/2017 CLINICAL DATA:  Acute generalized abdominal pain. EXAM: CT ABDOMEN AND PELVIS WITH CONTRAST TECHNIQUE: Multidetector CT imaging of the abdomen and pelvis was performed using the standard protocol following bolus administration of intravenous contrast. CONTRAST:  176m OMNIPAQUE IOHEXOL 300 MG/ML  SOLN COMPARISON:  CT scan of July 02, 2015. FINDINGS: Lower chest: Minimal left posterior basilar subsegmental atelectasis is noted. Hepatobiliary: No focal liver abnormality is seen. No gallstones, gallbladder wall thickening, or biliary dilatation. Pancreas: Unremarkable. No pancreatic ductal dilatation or surrounding inflammatory changes. Spleen: Normal in size without focal abnormality. Adrenals/Urinary Tract: Adrenal glands are unremarkable. Kidneys are normal, without renal calculi, focal lesion, or hydronephrosis. Bladder is unremarkable. Stomach/Bowel: Stomach is within normal limits. Appendix appears normal. No evidence of bowel wall thickening, distention, or inflammatory changes. Vascular/Lymphatic: No significant vascular findings are present. No enlarged abdominal or pelvic lymph nodes. Reproductive: Prostate is unremarkable. Other: Small fat containing periumbilical hernia is noted. No abnormal fluid collection is noted. Musculoskeletal: No acute or significant osseous findings. IMPRESSION: No significant abnormality seen in the abdomen or pelvis. Electronically Signed   By: JMarijo Conception M.D.   On: 10/09/2017 16:02    Time Spent in minutes  30   PLala LundM.D on 10/11/2017 at 10:04 AM  To page go to [email or web address] - password [email or web address] [address]

## 2017-10-11 NOTE — Progress Notes (Signed)
RN paged C. Bodenheimer, NP to make him aware patient had temp of 103, was given Hydrocodone 2 tablets, temp now 100.5.  Awaiting response.  RN will continue to monitor patient and make provider aware of any changes.  P.J. Henderson Newcomer, RN

## 2017-10-12 ENCOUNTER — Other Ambulatory Visit: Payer: Self-pay

## 2017-10-12 ENCOUNTER — Encounter (HOSPITAL_COMMUNITY): Payer: Self-pay | Admitting: General Practice

## 2017-10-12 DIAGNOSIS — R6520 Severe sepsis without septic shock: Secondary | ICD-10-CM

## 2017-10-12 DIAGNOSIS — G7281 Critical illness myopathy: Secondary | ICD-10-CM

## 2017-10-12 DIAGNOSIS — A021 Salmonella sepsis: Secondary | ICD-10-CM

## 2017-10-12 LAB — PROCALCITONIN: Procalcitonin: 0.13 ng/mL

## 2017-10-12 LAB — COMPREHENSIVE METABOLIC PANEL
ALT: 154 U/L — ABNORMAL HIGH (ref 0–44)
ANION GAP: 12 (ref 5–15)
AST: 123 U/L — ABNORMAL HIGH (ref 15–41)
Albumin: 3 g/dL — ABNORMAL LOW (ref 3.5–5.0)
Alkaline Phosphatase: 64 U/L (ref 38–126)
BILIRUBIN TOTAL: 0.8 mg/dL (ref 0.3–1.2)
BUN: 10 mg/dL (ref 6–20)
CALCIUM: 8.3 mg/dL — AB (ref 8.9–10.3)
CHLORIDE: 106 mmol/L (ref 98–111)
CO2: 19 mmol/L — ABNORMAL LOW (ref 22–32)
CREATININE: 1.32 mg/dL — AB (ref 0.61–1.24)
GFR calc non Af Amer: >60 mL/min (ref >60)
Glucose, Bld: 99 mg/dL (ref 70–99)
POTASSIUM: 3.8 mmol/L (ref 3.5–5.1)
Sodium: 137 mmol/L (ref 135–145)
Total Protein: 6.9 g/dL (ref 6.5–8.1)

## 2017-10-12 LAB — CBC
HEMATOCRIT: 34.7 % — AB (ref 39.0–52.0)
Hemoglobin: 12.1 g/dL — ABNORMAL LOW (ref 13.0–17.0)
MCH: 29.7 pg (ref 26.0–34.0)
MCHC: 34.9 g/dL (ref 30.0–36.0)
MCV: 85 fL (ref 78.0–100.0)
PLATELETS: 158 10*3/uL (ref 150–400)
RBC: 4.08 MIL/uL — ABNORMAL LOW (ref 4.22–5.81)
RDW: 13.4 % (ref 11.5–15.5)
WBC: 6.6 10*3/uL (ref 4.0–10.5)

## 2017-10-12 MED ORDER — CEFPODOXIME PROXETIL 200 MG PO TABS: 200.0000 mg | ORAL_TABLET | Freq: Two times a day (BID) | ORAL | 0 refills | Status: DC

## 2017-10-12 MED ORDER — POLYETHYLENE GLYCOL 3350 17 G PO PACK: 17.0000 g | PACK | Freq: Every day | ORAL | 0 refills | Status: DC

## 2017-10-12 MED ORDER — CEFDINIR 300 MG PO CAPS: 300.0000 mg | ORAL_CAPSULE | Freq: Two times a day (BID) | ORAL | 0 refills | Status: AC

## 2017-10-12 MED ORDER — CEPHALEXIN 500 MG PO CAPS: 500.0000 mg | ORAL_CAPSULE | Freq: Three times a day (TID) | ORAL | 0 refills | Status: DC

## 2017-10-12 MED FILL — CEFDINIR 300 MG CAPSULE: 300 | 10 days supply | Qty: 20 | Fill #0

## 2017-10-12 NOTE — Care Management Note (Signed)
Case Management Note  Patient Details  Name: Gabriel Vaughn MRN: [id number] Date of Birth: [birth year]  Subjective/Objective:  Sepsis 2/2 pyelonephritis.Live alone.            [name] (Brother)      [phone]        Action/Plan: Transition to home. Hospital f/u scheduled @ [facility]  by NCM -  noted on AVS and made pt aware.  Pt has transportation to home.  Expected Discharge Date:  10/12/17               Expected Discharge Plan:     In-House Referral:     Discharge planning Services  CM Consult, Follow-up appt scheduled, [facility]  Post Acute Care Choice:    Choice offered to:     DME Arranged:   N/A DME Agency:   N/A  HH Arranged:   N/A HH Agency:   N/A  Status of Service:  Completed, signed off  If discussed at Long Length of Stay Meetings, dates discussed:    Additional Comments:  [name], RN 10/12/2017, 11:32 AM

## 2017-10-12 NOTE — Discharge Summary (Addendum)
Gabriel Vaughn:[id number] DOB: August 19, 1965 DOA: 10/09/2017  PCP: Patient, No Pcp Per  Admit date: 10/09/2017  Discharge date: 10/12/2017  Admitted From: Home  Disposition:  Home   Recommendations for Outpatient Follow-up:   Follow up with PCP in 1-2 weeks  PCP Please obtain BMP/CBC, 2 view CXR in 1week,  (see Discharge instructions)   PCP Please follow up on the following pending results: Monitor CT scan findings.  Follow GC chlamydia results.   Home Health: None Equipment/Devices: None Consultations: None Discharge Condition: Stable CODE STATUS: Full Diet Recommendation: Heart Healthy     Chief Complaint  Patient presents with  . Abdominal Pain     Brief history of present illness from the day of admission and additional interim summary    Gabriel Vaughn a 52 y.o.malewho denies any significant past medical history, though does not see a physician regularly, now presenting to the emergency department with lower abdominal pain, nausea, general malaise, and chills. Patient reports that the symptoms developed over the past day or so and have steadily worsened. He reports dull aching pain to the suprapubic region/lower abdomen, localized, severe at times, and associated with nausea, chills, and malaise. He denies any shortness of breath, cough, vomiting, diarrhea, or rash. He notes that he may been exposed to STD, in the ER he had a tender prostate on exam, he had sepsis, CT abdomen pelvis was negative when he was admitted.                                                                 Hospital Course    1.  Sepsis due to pyelonephritis.  No signs of urinary retention, initial CT was unrevealing but bilateral pyelonephritis suspicious on repeat CT, his cultures were unremarkable, he responded well to  Rocephin, also received a gram of azithromycin in the ER since he has history of STDs, HIV and RPR are unremarkable, GC and Chlamydia testing is still pending.  His sepsis physiology has resolved, again cultures negative no afebrile, will be given 10 more days of oral Vantin and discharged with outpatient PCP follow-up.    Recommend PCP to kindly consider one-time outpatient urology follow-up due to unusual presentation of bilateral pyelonephritis in the male with no present signs of bladder outlet obstruction.   2.  Mild ARF and hyponatremia - due to #1 above with some dehydration.  Improved after IV fluids, discontinue NSAIDs upon discharge which she takes as needed at home, requested to follow with PCP within a week to get BMP rechecked and monitored.  Question some underlying CKD 2.   3.  Nonspecific mild increase in AST ALT.  Stable total bilirubin, stable CT scan abdomen pelvis x2, no right upper quadrant pain, this could have been due to antibiotics or infection requested him to follow  with his PCP in a week and get it rechecked.   Discharge diagnosis     Principal Problem:   Sepsis (HCC) Active Problems:   Acute prostatitis   Mild renal insufficiency    Discharge instructions    Discharge Instructions    Diet - low sodium heart healthy   Complete by:  As directed    Discharge instructions   Complete by:  As directed    Follow with Primary MD in 7 days   Get CBC, CMP, 2 view Chest X ray checked  by Primary MD  in 5-7 days   Activity: As tolerated with Full fall precautions use walker/cane & assistance as needed  Disposition Home    Diet: Heart Healthy     Special Instructions: If you have smoked or chewed Tobacco  in the last 2 yrs please stop smoking, stop any regular Alcohol  and or any Recreational drug use.  On your next visit with your primary care physician please Get Medicines reviewed and adjusted.  Please request your Prim.MD to go over all Hospital Tests  and Procedure/Radiological results at the follow up, please get all Hospital records sent to your Prim MD by signing hospital release before you go home.  If you experience worsening of your admission symptoms, develop shortness of breath, life threatening emergency, suicidal or homicidal thoughts you must seek medical attention immediately by calling 911 or calling your MD immediately  if symptoms less severe.  You Must read complete instructions/literature along with all the possible adverse reactions/side effects for all the Medicines you take and that have been prescribed to you. Take any new Medicines after you have completely understood and accpet all the possible adverse reactions/side effects.                                                 [id number]       Britton Bera was admitted to the Hospital on 10/09/2017 and Discharged  10/12/2017 and should be excused from work/school   for 4 days starting from date -  10/09/2017 , may return to work/school without any restrictions.  Call Susa Raring MD, [facility]  [phone] with questions.  Susa Raring M.D on 10/12/2017,at 8:49 AM  [facility]   Office  [phone]   Increase activity slowly   Complete by:  As directed       Discharge Medications   Allergies as of 10/12/2017      Reactions   Vinegar [acetic Acid] Swelling   Swelling where contact is made      Medication List    STOP taking these medications   naproxen 375 MG tablet Commonly known as:  NAPROSYN     TAKE these medications   cefpodoxime 200 MG tablet Commonly known as:  VANTIN Take 1 tablet (200 mg total) by mouth 2 (two) times daily.   polyethylene glycol packet Commonly known as:  MIRALAX / GLYCOLAX Take 17 g by mouth daily.       Follow-up Information    PCP. Schedule an appointment as soon as possible for a visit in 1 week(s).   Why:  CBC, CMP recheck.  Follow on GC chlamydia results.  Also request your  PCP to refer you to urologist.       [facility]. Schedule an appointment as soon as  possible for a visit on 10/20/2017.   Why:  CBC, CMP recheck.  Follow on GC chlamydia results.  Also request your PCP to refer you to urologist. Appointment time 10:50 am Contact information: [address] [place] [address] [phone]          Major procedures and Radiology Reports - PLEASE review detailed and final reports thoroughly  -     Bilateral lower extremity duplex.  No DVT    Dg Chest 2 View  Result Date: 10/09/2017 CLINICAL DATA:  Fever and body aches. EXAM: CHEST - 2 VIEW COMPARISON:  None. FINDINGS: The heart size is borderline. The hila, mediastinum, lungs, and pleura are otherwise unremarkable. IMPRESSION: No active cardiopulmonary disease. Electronically Signed   By: David  Williams III M.D   On: 10/09/2017 19:35   Ct Abdomen Pelvis W Contrast  Result Date: 10/11/2017 CLINICAL DATA:  Persistent BILATERAL lower abdominal pain, nausea and fever, on antibiotics since Saturday, history smoking EXAM: CT ABDOMEN AND PELVIS WITH CONTRAST TECHNIQUE: Multidetector CT imaging of the abdomen and pelvis was performed using the standard protocol following bolus administration of intravenous contrast. Sagittal and coronal MPR images reconstructed from axial data set. CONTRAST:  100 ML ISOVUE-300 IOPAMIDOL (ISOVUE-300) INJECTION 61% IV. Dilute oral contrast. COMPARISON:  10/09/2017 FINDINGS: Lower chest: Bibasilar atelectasis and minimal pleural effusions greater on RIGHT Hepatobiliary: Gallbladder and liver normal appearance Pancreas: Normal appearance Spleen: Normal appearance Adrenals/Urinary Tract: Adrenal glands normal appearance. BILATERAL perinephric edema again noted, slightly increased, extending inferiorly into the upper pelvis bilaterally, could reflect urinary tract infection/pyelonephritis. No definite nephrographic defects identified.  No definite renal mass, hydronephrosis or urinary tract calcification. Bladder and ureters unremarkable. Stomach/Bowel: Normal appendix. Stomach and bowel loops unremarkable. Vascular/Lymphatic: Aorta normal caliber. Vascular structures patent. No adenopathy. Reproductive: Unremarkable Other: No free air or free fluid. Small umbilical hernia containing fat. Musculoskeletal: Short pedicles throughout lower lumbar spine. Moderate AP narrowing of the spinal canal at L3-L4 and severe narrowing at L4-L5. No acute osseous findings. IMPRESSION: Spinal stenosis severe at L4-L5 and to a lesser degree at L5-3 L4. Increased perinephric edema since the previous exam now extending inferiorly into the upper pelvis, raising question of urinary tract infection/pyelonephritis; recommend correlation with urinalysis. Bibasilar atelectasis and minimal pleural effusions greater on RIGHT. Small umbilical hernia containing fat. Electronically Signed   By: Mark  Boles M.D.   On: 10/11/2017 13:51   Ct Abdomen Pelvis W Contrast  Result Date: 10/09/2017 CLINICAL DATA:  Acute generalized abdominal pain. EXAM: CT ABDOMEN AND PELVIS WITH CONTRAST TECHNIQUE: Multidetector CT imaging of the abdomen and pelvis was performed using the standard protocol following bolus admi44034MaisiRoannDaphine Deutsc755Memori44034MaisiRoannDaphine Deutsc99[facility]44034MaisiRoannDaphine DeutsSharkey-Iss44034MaisiRoannDap44034MaisiRoannDaphine Deutsc9(5[place] Vall44034MaisiRoannDa44034MaisiRoannDaphine Deutsc9[facility]44034MaisiRoannDaphine Deutsc346[facility]44034MaisiRoannDaphine DeutsEl Paso44034MaisiRoannDaphine Deutsc82(4[facility]7Kateri [address]9[facility], LLCBon : Minimal left posterior basilar subsegmental atelectasis is noted. Hepatobiliary: No focal liver abnormality is seen. No gallstones, gallbladder wall thickening, or biliary dilatation. Pancreas: Unremarkable. No pancreatic ductal dilatation or surrounding inflammatory changes. Spleen: Normal in size without focal abnormality. Adrenals/Urinary Tract: Adrenal glands are unremarkable. Kidneys are normal, without renal calculi, focal lesion, or hydronephrosis. Bladder is unremarkable. Stomach/Bowel: Stomach is within normal limits.  Appendix appears normal. No evidence of bowel wall thickening, distention, or inflammatory changes. Vascular/Lymphatic: No significant vascular findings are present. No enlarged abdominal or pelvic lymph nodes. Reproductive: Prostate is unremarkable. Other: Small fat containing periumbilical hernia is noted. No abnormal fluid collection is noted. Musculoskeletal: No acute or significant osseous findings. IMPRESSION: No significant abnormality  seen in the abdomen or pelvis. Electronically Signed   By: Lupita Raider, M.D.   On: 10/09/2017 16:02    Micro Results     Recent Results (from the past 240 hour(s))  Urine culture     Status: Abnormal   Collection Time: 10/09/17  4:59 PM  Result Value Ref Range Status   Specimen Description URINE, RANDOM  Final   Special Requests   Final    NONE Performed at [facility], [address], [place], [place] [address]    Culture MULTIPLE SPECIES PRESENT, SUGGEST RECOLLECTION (A)  Final   Report Status 10/11/2017 FINAL  Final  Culture, blood (routine x 2)     Status: None (Preliminary result)   Collection Time: 10/09/17  7:30 PM  Result Value Ref Range Status   Specimen Description BLOOD RIGHT ARM  Final   Special Requests   Final    BOTTLES DRAWN AEROBIC AND ANAEROBIC Blood Culture adequate volume   Culture   Final    NO GROWTH 3 DAYS Performed at [facility], [address] [place], [place] [address]    Report Status PENDING  Incomplete  Culture, blood (routine x 2)     Status: None (Preliminary result)   Collection Time: 10/09/17  7:45 PM  Result Value Ref Range Status   Specimen Description BLOOD RIGHT HAND  Final   Special Requests   Final    BOTTLES DRAWN AEROBIC AND ANAEROBIC Blood Culture adequate volume   Culture   Final    NO GROWTH 3 DAYS Performed at [facility], [address] [place], [place] [address]    Report Status PENDING  Incomplete  MRSA PCR Screening     Status: None   Collection Time: 10/09/17   9:17 PM  Result Value Ref Range Status   MRSA by PCR NEGATIVE NEGATIVE Final    Comment:        The GeneXpert MRSA Assay (FDA approved for NASAL specimens only), is one component of a comprehensive MRSA colonization surveillance program. It is not intended to diagnose MRSA infection nor to guide or monitor treatment for MRSA infections. Performed at [facility], [address], [place], [place] [address]     Today   Subjective    Senan Urey today has no headache,no chest abdominal pain,no new weakness tingling or numbness, feels much better wants to go home today.     Objective   Blood pressure 130/83, pulse (!) 56, temperature 99.2 F (37.3 C), temperature source Oral, resp. rate 18, SpO2 97 %.   Intake/Output Summary (Last 24 hours) at 10/12/2017 0955 Last data filed at 10/12/2017 0900 Gross per 24 hour  Intake 656.71 ml  Output 1700 ml  Net -1043.29 ml    Exam Awake Alert, Oriented x 3, No new F.N deficits, Normal affect [place].AT,PERRAL Supple Neck,No JVD, No cervical lymphadenopathy appriciated.  Symmetrical Chest wall movement, Good air movement bilaterally, CTAB RRR,No Gallops,Rubs or new Murmurs, No Parasternal Heave +ve B.Sounds, Abd Soft, Non tender, No organomegaly appriciated, No rebound -guarding or rigidity. No Cyanosis, Clubbing or edema, No new Rash or bruise   Data Review   CBC w Diff:  Lab Results  Component Value Date   WBC 6.6 10/12/2017   HGB 12.1 (L) 10/12/2017   HCT 34.7 (L) 10/12/2017   PLT 158 10/12/2017   LYMPHOPCT 13 10/10/2017   MONOPCT 13 10/10/2017   EOSPCT 0 10/10/2017   BASOPCT 0  10/10/2017    CMP:  Lab Results  Component Value Date   NA 137 10/12/2017   K 3.8 10/12/2017   CL 106 10/12/2017   CO2 19 (L) 10/12/2017   BUN 10 10/12/2017   CREATININE 1.32 (H) 10/12/2017   PROT 6.9 10/12/2017   ALBUMIN 3.0 (L) 10/12/2017   BILITOT 0.8 10/12/2017   ALKPHOS 64 10/12/2017   AST 123 (H) 10/12/2017   ALT 154 (H)  10/12/2017  .   Total Time in preparing paper work, data evaluation and todays exam - 35 minutes  Susa Raring M.D on 10/12/2017 at 9:55 AM  [facility]   Office  [phone]

## 2017-10-12 NOTE — Discharge Instructions (Signed)
Follow with Primary MD in 7 days   Get CBC, CMP, 2 view Chest X ray checked  by Primary MD  in 5-7 days   Activity: As tolerated with Full fall precautions use walker/cane & assistance as needed  Disposition Home    Diet: Heart Healthy     Special Instructions: If you have smoked or chewed Tobacco  in the last 2 yrs please stop smoking, stop any regular Alcohol  and or any Recreational drug use.  On your next visit with your primary care physician please Get Medicines reviewed and adjusted.  Please request your Prim.MD to go over all Hospital Tests and Procedure/Radiological results at the follow up, please get all Hospital records sent to your Prim MD by signing hospital release before you go home.  If you experience worsening of your admission symptoms, develop shortness of breath, life threatening emergency, suicidal or homicidal thoughts you must seek medical attention immediately by calling 911 or calling your MD immediately  if symptoms less severe.  You Must read complete instructions/literature along with all the possible adverse reactions/side effects for all the Medicines you take and that have been prescribed to you. Take any new Medicines after you have completely understood and accpet all the possible adverse reactions/side effects.                                                         Gabriel Vaughn was admitted to the Hospital on 10/09/2017 and Discharged  10/12/2017 and should be excused from work/school   for 4  days starting from date -  10/09/2017 , may return to work/school without any restrictions.  Call Susa Raring MD, [facility]  [phone] with questions.  Susa Raring M.D on 10/12/2017,at 8:49 AM  [facility]   Office  [phone]

## 2017-10-14 LAB — CULTURE, BLOOD (ROUTINE X 2)
CULTURE: NO GROWTH
CULTURE: NO GROWTH
SPECIAL REQUESTS: ADEQUATE
SPECIAL REQUESTS: ADEQUATE

## 2017-10-20 ENCOUNTER — Inpatient Hospital Stay: Payer: Self-pay

## 2018-08-16 ENCOUNTER — Other Ambulatory Visit: Payer: Self-pay | Admitting: *Deleted

## 2018-12-02 ENCOUNTER — Other Ambulatory Visit: Payer: Self-pay

## 2018-12-02 ENCOUNTER — Emergency Department (HOSPITAL_COMMUNITY)
Admission: EM | Admit: 2018-12-02 | Discharge: 2018-12-02 | Disposition: A | Discharge status: Home / Self Care | Payer: Self-pay | Attending: Emergency Medicine | Admitting: Emergency Medicine

## 2018-12-02 ENCOUNTER — Encounter (HOSPITAL_COMMUNITY): Payer: Self-pay | Admitting: Emergency Medicine

## 2018-12-02 DIAGNOSIS — F17210 Nicotine dependence, cigarettes, uncomplicated: Secondary | ICD-10-CM | POA: Insufficient documentation

## 2018-12-02 DIAGNOSIS — Z79899 Other long term (current) drug therapy: Secondary | ICD-10-CM | POA: Insufficient documentation

## 2018-12-02 DIAGNOSIS — T7840XA Allergy, unspecified, initial encounter: Secondary | ICD-10-CM | POA: Insufficient documentation

## 2018-12-02 MED ORDER — DIPHENHYDRAMINE HCL 25 MG PO CAPS
25.0000 mg | ORAL_CAPSULE | Freq: Once | ORAL | Status: AC
  Administered 2018-12-02: 25 mg via ORAL
  Filled 2018-12-02: qty 1

## 2018-12-02 MED ORDER — FAMOTIDINE 20 MG PO TABS
20.0000 mg | ORAL_TABLET | Freq: Once | ORAL | Status: AC
  Administered 2018-12-02: 20 mg via ORAL
  Filled 2018-12-02: qty 1

## 2018-12-02 NOTE — ED Notes (Signed)
Patient verbalizes understanding of discharge instructions. Opportunity for questioning and answers were provided. Armband removed by staff, pt discharged from ED.  

## 2018-12-02 NOTE — ED Provider Notes (Signed)
[facility] EMERGENCY DEPARTMENT Provider Note   CSN: [id number] Arrival date & time: 12/02/18  1815     History   Chief Complaint Chief Complaint  Patient presents with  . Allergic Reaction    HPI Gabriel Vaughn is a 53 y.o. male presenting to the emergency department with concern for possible allergic reaction.  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  He states he began feeling some comfort in his throat and was concerned that maybe the sauce had some vinegar in it, however he cannot find the container to see the ingredients.  He did not treat his symptoms though he rushed here to the ED for evaluation.  He feels as though the back of his tongue may be starting to swell though he is having no difficulty breathing or swallowing.  He has no rashes, abdominal pain, or any other associated symptoms.     The history is provided by the patient.    Past Medical History:  Diagnosis Date  . Abdominal pain 10/09/2017    Patient Active Problem List   Diagnosis Date Noted  . Sepsis ([place]) 10/09/2017  . Acute prostatitis 10/09/2017  . Mild renal insufficiency 10/09/2017    Past Surgical History:  Procedure Laterality Date  . knee surgery          Home Medications    Prior to Admission medications   Medication Sig Start Date End Date Taking? Authorizing Provider  cefdinir (OMNICEF) 300 MG capsule Take 1 capsule (300 mg total) by mouth 2 (two) times daily. 10/12/17   Thurnell Lose, MD  polyethylene glycol (MIRALAX / GLYCOLAX) packet Take 17 g by mouth daily. 10/12/17   Thurnell Lose, MD    Family History No family history on file.  Social History Social History   Tobacco Use  . Smoking status: Current Some Day Smoker    Types: Cigarettes  . Smokeless tobacco: Never Used  Substance Use Topics  . Alcohol use: Yes    Comment: social  . Drug use: Never     Allergies   Vinegar [acetic acid]    Review of Systems Review of Systems  All other systems reviewed and are negative.    Physical Exam Updated Vital Signs BP (!) 155/104   Pulse 68   Temp 98.4 F (36.9 C) (Oral)   Resp 18   Ht 5\' 6"  (1.676 m)   Wt (!) 145.2 kg   SpO2 99%   BMI 51.65 kg/m   Physical Exam Vitals signs and nursing note reviewed.  Constitutional:      General: He is not in acute distress.    Appearance: He is well-developed. He is obese. He is not ill-appearing.  HENT:     Head: Normocephalic and atraumatic.     Mouth/Throat:     Comments: Oropharynx is clear.  No obvious swelling of the lips or tongue, no swelling of the throat. Eyes:     Conjunctiva/sclera: Conjunctivae normal.  Cardiovascular:     Rate and Rhythm: Normal rate and regular rhythm.  Pulmonary:     Effort: Pulmonary effort is normal. No respiratory distress.     Breath sounds: Normal breath sounds. No stridor.  Abdominal:     Palpations: Abdomen is soft.  Skin:    General: Skin is warm.     Findings: No rash.  Neurological:     Mental Status: He is alert.  Psychiatric:  Behavior: Behavior normal.      ED Treatments / Results  Labs (all labs ordered are listed, but only abnormal results are displayed) Labs Reviewed - No data to display  EKG None  Radiology No results found.  Procedures Procedures (including critical care time)  Medications Ordered in ED Medications  diphenhydrAMINE (BENADRYL) capsule 25 mg (has no administration in time range)  famotidine (PEPCID) tablet 20 mg (has no administration in time range)     Initial Impression / Assessment and Plan / ED Course  I have reviewed the triage vital signs and the nursing notes.  Pertinent labs & imaging results that were available during my care of the patient were reviewed by me and considered in my medical decision making (see chart for details).       Patient presenting with concern for possible allergic reaction and concern for  possibly ingesting vinegar for which is allergic to.  On exam, oropharynx is clear and there is no evidence of swelling though patient is complaining of some scratchy feeling in his throat.  Will dose with Benadryl and Pepcid and monitor.  Care assumed at shift change by PA Laveda Heinsohn, pending re-evaluation.  Final Clinical Impressions(s) / ED Diagnoses   Final diagnoses:  None    ED Discharge Orders    None       Robinson, [place] N, PA-C 12/02/18 1846    Virgina Norfolk, DO 12/02/18 2315

## 2018-12-02 NOTE — ED Notes (Signed)
Pt advises symptoms have improved significantly. Denies dyspnea at this time.

## 2018-12-02 NOTE — ED Provider Notes (Signed)
Patient came in with complaint of possible allergic reaction after ingesting vinegar as he is allergic to that.  He was initially complaining of throat irritation.  Patient was given Benadryl and Pepcid along with monitoring for 2 hours.  At this time he felt much better feels comfortable enough to go home.  On examination throat exam is unremarkable.  BP (!) 146/72   Pulse 74   Temp 98.4 F (36.9 C) (Oral)   Resp 16   Ht 5\' 6"  (1.676 m)   Wt (!) 145.2 kg   SpO2 99%   BMI 51.65 kg/m     Domenic Moras, PA-C 12/02/18 2127    Drenda Freeze, MD 12/02/18 [phone]

## 2018-12-02 NOTE — ED Notes (Signed)
Pt advises throat still feels "scratchy" but denies dyspnea. Pt is mildly anxious.

## 2018-12-02 NOTE — ED Triage Notes (Signed)
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 Has some discomfort in his throat.

## 2019-06-10 ENCOUNTER — Emergency Department (HOSPITAL_COMMUNITY): Payer: Self-pay

## 2019-06-10 ENCOUNTER — Encounter (HOSPITAL_COMMUNITY): Payer: Self-pay | Admitting: Emergency Medicine

## 2019-06-10 ENCOUNTER — Other Ambulatory Visit: Payer: Self-pay

## 2019-06-10 ENCOUNTER — Emergency Department (HOSPITAL_COMMUNITY)
Admission: EM | Admit: 2019-06-10 | Discharge: 2019-06-10 | Disposition: A | Discharge status: Home / Self Care | Payer: Self-pay | Attending: Emergency Medicine | Admitting: Emergency Medicine

## 2019-06-10 DIAGNOSIS — K5900 Constipation, unspecified: Secondary | ICD-10-CM | POA: Insufficient documentation

## 2019-06-10 DIAGNOSIS — R1084 Generalized abdominal pain: Secondary | ICD-10-CM | POA: Insufficient documentation

## 2019-06-10 DIAGNOSIS — R110 Nausea: Secondary | ICD-10-CM | POA: Insufficient documentation

## 2019-06-10 DIAGNOSIS — F17210 Nicotine dependence, cigarettes, uncomplicated: Secondary | ICD-10-CM | POA: Insufficient documentation

## 2019-06-10 DIAGNOSIS — R109 Unspecified abdominal pain: Secondary | ICD-10-CM

## 2019-06-10 LAB — COMPREHENSIVE METABOLIC PANEL
ALT: 24 U/L (ref 0–44)
AST: 22 U/L (ref 15–41)
Albumin: 3.7 g/dL (ref 3.5–5.0)
Alkaline Phosphatase: 72 U/L (ref 38–126)
Anion gap: 8 (ref 5–15)
BUN: 14 mg/dL (ref 6–20)
CO2: 23 mmol/L (ref 22–32)
Calcium: 8.5 mg/dL — ABNORMAL LOW (ref 8.9–10.3)
Chloride: 109 mmol/L (ref 98–111)
Creatinine, Ser: 1.31 mg/dL — ABNORMAL HIGH (ref 0.61–1.24)
GFR calc Af Amer: >60 mL/min (ref >60)
GFR calc non Af Amer: >60 mL/min (ref >60)
Glucose, Bld: 99 mg/dL (ref 70–99)
Potassium: 3.7 mmol/L (ref 3.5–5.1)
Sodium: 140 mmol/L (ref 135–145)
Total Bilirubin: 0.9 mg/dL (ref 0.3–1.2)
Total Protein: 7.6 g/dL (ref 6.5–8.1)

## 2019-06-10 LAB — CBC
HCT: 41.1 % (ref 39.0–52.0)
Hemoglobin: 14.3 g/dL (ref 13.0–17.0)
MCH: 29.4 pg (ref 26.0–34.0)
MCHC: 34.8 g/dL (ref 30.0–36.0)
MCV: 84.4 fL (ref 80.0–100.0)
Platelets: 198 10*3/uL (ref 150–400)
RBC: 4.87 MIL/uL (ref 4.22–5.81)
RDW: 14.4 % (ref 11.5–15.5)
WBC: 5.8 10*3/uL (ref 4.0–10.5)
nRBC: 0 % (ref 0.0–0.2)

## 2019-06-10 LAB — URINALYSIS, ROUTINE W REFLEX MICROSCOPIC
Bilirubin Urine: NEGATIVE
Glucose, UA: NEGATIVE mg/dL
Hgb urine dipstick: NEGATIVE
Ketones, ur: NEGATIVE mg/dL
Leukocytes,Ua: NEGATIVE
Nitrite: NEGATIVE
Protein, ur: NEGATIVE mg/dL
Specific Gravity, Urine: 1.035 — ABNORMAL HIGH (ref 1.005–1.030)
pH: 5 (ref 5.0–8.0)

## 2019-06-10 LAB — LIPASE, BLOOD: Lipase: 31 U/L (ref 11–51)

## 2019-06-10 MED ORDER — DICYCLOMINE HCL 10 MG/ML IM SOLN
20.0000 mg | Freq: Once | INTRAMUSCULAR | Status: DC
  Filled 2019-06-10: qty 2

## 2019-06-10 MED ORDER — MORPHINE SULFATE (PF) 4 MG/ML IV SOLN
4.0000 mg | Freq: Once | INTRAVENOUS | Status: AC
  Administered 2019-06-10: 4 mg via INTRAVENOUS
  Filled 2019-06-10: qty 1

## 2019-06-10 MED ORDER — POLYETHYLENE GLYCOL 3350 17 G PO PACK: 17.0000 g | PACK | Freq: Every day | ORAL | 0 refills | Status: AC

## 2019-06-10 MED ORDER — FLEET ENEMA 7-19 GM/118ML RE ENEM
1.0000 | ENEMA | Freq: Once | RECTAL | Status: AC
  Administered 2019-06-10: 1 via RECTAL
  Filled 2019-06-10: qty 1

## 2019-06-10 MED ORDER — IOHEXOL 300 MG/ML  SOLN
100.0000 mL | Freq: Once | INTRAMUSCULAR | Status: AC | PRN
  Administered 2019-06-10: 100 mL via INTRAVENOUS

## 2019-06-10 MED ORDER — SODIUM CHLORIDE 0.9 % IV BOLUS
1000.0000 mL | Freq: Once | INTRAVENOUS | Status: AC
  Administered 2019-06-10: 1000 mL via INTRAVENOUS

## 2019-06-10 MED ORDER — PROCTOFOAM HC 1-1 % EX FOAM: 1.0000 | Freq: Two times a day (BID) | CUTANEOUS | 0 refills | Status: AC

## 2019-06-10 MED ORDER — SODIUM CHLORIDE 0.9% FLUSH: 3.0000 mL | Freq: Once | INTRAVENOUS | Status: DC

## 2019-06-10 NOTE — ED Provider Notes (Signed)
[facility] EMERGENCY DEPARTMENT Provider Note   CSN: [id number] Arrival date & time: 06/10/19  1235     History Chief Complaint  Patient presents with  . Abdominal Pain    Gabriel Vaughn is a 54 y.o. male.  HPI   Pt is a 54 y/o male with a h/o prostatitis, renal insufficiency, sepsis, who presents to the ED today for eval of lower abd abd pain and constipation. States he started becoming constipated and having the pain about 1 week ago. Sxs have been constant since onset and they are severe in nature. He took some colace and used preparation H and was able to have a large BM however he states he is still having significant abd pain.   He reports associated rectal pain with BMs and has h/o hemorrhoies. Also feels like he is having difficulty urinating and muscle cramping.   Past Medical History:  Diagnosis Date  . Abdominal pain 10/09/2017    Patient Active Problem List   Diagnosis Date Noted  . Sepsis (HCC) 10/09/2017  . Acute prostatitis 10/09/2017  . Mild renal insufficiency 10/09/2017    Past Surgical History:  Procedure Laterality Date  . knee surgery         No family history on file.  Social History   Tobacco Use  . Smoking status: Current Some Day Smoker    Types: Cigarettes  . Smokeless tobacco: Never Used  Substance Use Topics  . Alcohol use: Yes    Comment: social  . Drug use: Never    Home Medications Prior to Admission medications   Medication Sig Start Date End Date Taking? Authorizing Provider  cefdinir (OMNICEF) 300 MG capsule Take 1 capsule (300 mg total) by mouth 2 (two) times daily. 10/12/17   Leroy Sea, MD  hydrocortisone-pramoxine (PROCTOFOAM [facility]) rectal foam Place 1 applicator rectally 2 (two) times daily. 06/10/19   Rukia Mcgillivray S, PA-C  polyethylene glycol (MIRALAX) 17 g packet Take 17 g by mouth daily. Dissolve one cap full in solution (water, gatorade, etc.) and administer once cap-full daily. You may  titrate up daily by 1 cap-full until the patient is having pudding consistency of stools. After the patient is able to start passing softer stools they will need to be on 1/2 cap-full daily for 2 weeks. 06/10/19   Elisabella Hacker S, PA-C    Allergies    Vinegar [acetic acid]  Review of Systems   Review of Systems  Constitutional: Negative for chills and fever.  HENT: Negative for ear pain and sore throat.   Eyes: Negative for visual disturbance.  Respiratory: Negative for cough and shortness of breath.   Cardiovascular: Negative for chest pain.  Gastrointestinal: Positive for abdominal pain, constipation and nausea. Negative for vomiting.  Genitourinary: Negative for dysuria and hematuria.  Musculoskeletal: Negative for back pain.  Skin: Negative for rash.  Neurological: Negative for headaches.  All other systems reviewed and are negative.   Physical Exam Updated Vital Signs BP 112/78   Pulse (!) 58   Temp 98.1 F (36.7 C) (Oral)   Resp 18   Ht 5\' 7"  (1.702 m)   Wt 136.1 kg   SpO2 100%   BMI 46.99 kg/m   Physical Exam Vitals and nursing note reviewed.  Constitutional:      Appearance: He is well-developed.  HENT:     Head: Normocephalic and atraumatic.  Eyes:     Conjunctiva/sclera: Conjunctivae normal.  Cardiovascular:     Rate and Rhythm:  Normal rate and regular rhythm.     Heart sounds: Normal heart sounds. No murmur.  Pulmonary:     Effort: Pulmonary effort is normal. No respiratory distress.     Breath sounds: Normal breath sounds. No wheezing, rhonchi or rales.  Abdominal:     General: Bowel sounds are normal.     Palpations: Abdomen is soft.     Tenderness: There is abdominal tenderness in the right upper quadrant, right lower quadrant and left lower quadrant. There is guarding (rlq).  Musculoskeletal:     Cervical back: Neck supple.  Skin:    General: Skin is warm and dry.  Neurological:     Mental Status: He is alert.     ED Results / Procedures /  Treatments   Labs (all labs ordered are listed, but only abnormal results are displayed) Labs Reviewed  COMPREHENSIVE METABOLIC PANEL - Abnormal; Notable for the following components:      Result Value   Creatinine, Ser 1.31 (*)    Calcium 8.5 (*)    All other components within normal limits  URINALYSIS, ROUTINE W REFLEX MICROSCOPIC - Abnormal; Notable for the following components:   Specific Gravity, Urine 1.035 (*)    All other components within normal limits  URINE CULTURE  LIPASE, BLOOD  CBC    EKG None  Radiology CT ABDOMEN PELVIS W CONTRAST  Result Date: 06/10/2019 CLINICAL DATA:  54 year old male with right lower quadrant abdominal pain. EXAM: CT ABDOMEN AND PELVIS WITH CONTRAST TECHNIQUE: Multidetector CT imaging of the abdomen and pelvis was performed using the standard protocol following bolus administration of intravenous contrast. CONTRAST:  111mL OMNIPAQUE IOHEXOL 300 MG/ML  SOLN COMPARISON:  CT abdomen pelvis dated 10/11/2017. FINDINGS: Lower chest: The visualized lung bases are clear. No intra-abdominal free air or free fluid. Hepatobiliary: Probable fatty infiltration of the liver. No intrahepatic biliary ductal dilatation. The gallbladder is unremarkable. Pancreas: Unremarkable. No pancreatic ductal dilatation or surrounding inflammatory changes. Spleen: Normal in size without focal abnormality. Adrenals/Urinary Tract: The adrenal glands are unremarkable. There is no hydronephrosis on either side. There is symmetric enhancement and excretion of contrast by both kidneys. There is bilateral perinephric stranding as seen previously, nonspecific. Correlation with urinalysis recommended to exclude UTI. There is a 2 cm fluid density along the lateral interpolar left kidney which may represent focal collection of perinephric stranding or an exophytic renal cyst. Ultrasound may provide better evaluation on a nonemergent basis. The visualized ureters and urinary bladder appear  unremarkable. Stomach/Bowel: There is moderate stool throughout the colon. There is no bowel obstruction or active inflammation. The appendix is normal. Vascular/Lymphatic: The abdominal aorta and IVC are unremarkable. No portal venous gas. There is no adenopathy. Reproductive: The prostate and seminal vesicles are grossly unremarkable. Other: Small fat containing umbilical hernia. Musculoskeletal: Degenerative changes of the spine. No acute osseous pathology. IMPRESSION: 1. Bilateral perinephric stranding as seen previously. Correlation with urinalysis recommended to exclude UTI. 2. No bowel obstruction. Normal appendix. Electronically Signed   By: Anner Crete M.D.   On: 06/10/2019 17:12    Procedures Procedures (including critical care time)  Medications Ordered in ED Medications  sodium chloride flush (NS) 0.9 % injection 3 mL (has no administration in time range)  dicyclomine (BENTYL) injection 20 mg (20 mg Intramuscular Refused 06/10/19 1625)  sodium chloride 0.9 % bolus 1,000 mL (0 mLs Intravenous Stopped 06/10/19 2041)  sodium phosphate (FLEET) 7-19 GM/118ML enema 1 enema (1 enema Rectal Given 06/10/19 2041)  iohexol (OMNIPAQUE) 300  MG/ML solution 100 mL (100 mLs Intravenous Contrast Given 06/10/19 1650)  morphine 4 MG/ML injection 4 mg (4 mg Intravenous Given 06/10/19 1846)    ED Course  I have reviewed the triage vital signs and the nursing notes.  Pertinent labs & imaging results that were available during my care of the patient were reviewed by me and considered in my medical decision making (see chart for details).    MDM Rules/Calculators/A&P                      54 year old male presenting for evaluation of lower abdominal pain, constipation, difficulty urinating.  Afebrile on arrival with normal vital signs.  Reviewed/interpreted lab CBC showed no evidence of leukocytosis or anemia CMP showed normal electrolytes and liver function.  Kidney function is slightly elevated  but patient has had similar readings in the past. Lipase is negative UA without any leukocytes or nitrites.. Urine culture sent.   CT abd/pelvis with bilateral perinephric stranding as seen previously. Correlation with urinalysis recommended to exclude UTI. No bowel obstruction. Normal appendix.  Moderate stool burden was noted throughout the colon  Pt given pain meds, ivf, enema.  On reassessment he states he was able to have a large bowel movement and his abdominal pain is significantly improved.  Repeat abdominal exam is benign.  We discussed his work-up including the perinephric stranding.  In regards to the side instructed to follow-up with neurology as this was also seen on prior CT imaging.  He does not have any evidence of UTI today but culture was sent.  I believe his abdominal pain was likely secondary to his constipation I will start him on MiraLAX for this.  I will also give him some medication to help with his reported hemorrhoids.  He states he is currently in the process of getting established with a PCP.  I will give him information for the most common health wellness clinic.  Advised him to return to the ED for any new or worsening symptoms.  He voiced understanding of plan and return all questions answered.  Patient stable for discharge.   Final Clinical Impression(s) / ED Diagnoses Final diagnoses:  Abdominal pain, unspecified abdominal location  Constipation, unspecified constipation type    Rx / DC Orders ED Discharge Orders         Ordered    polyethylene glycol (MIRALAX) 17 g packet  Daily     06/10/19 2018    hydrocortisone-pramoxine (PROCTOFOAM [facility]) rectal foam  2 times daily     06/10/19 2018           Rayne Du 06/10/19 2126    Linwood Dibbles, MD 06/11/19 [phone]

## 2019-06-10 NOTE — Discharge Instructions (Addendum)
Take miralax as directed for you constipation.  Your CT scan showed some evidence of perinephric stranding however you did not have any evidence of urinary tract infection today.  Due to this finding my recommending that you follow-up with [facility] for further evaluation of this.  Please follow up with your primary care provider within 5-7 days for re-evaluation of your symptoms. If you do not have a primary care provider, information for a healthcare clinic has been provided for you to make arrangements for follow up care. Please return to the emergency department for any new or worsening symptoms.

## 2019-06-10 NOTE — ED Notes (Signed)
Patient verbalizes understanding of discharge instructions. Opportunity for questioning and answers were provided. Armband removed by staff, pt discharged from ED ambulatory.   

## 2019-06-10 NOTE — ED Notes (Signed)
Pt on BSC, reports successful bowel movement. Wipes provided to pt

## 2019-06-10 NOTE — ED Triage Notes (Signed)
C/o constipation and generalized abd pain since Sunday.  Taking Colace.  Reports BM this morning but continues to have generalized abd pain.  Denies nausea and vomiting.

## 2019-06-11 LAB — URINE CULTURE: Culture: NO GROWTH

## 2021-10-05 IMAGING — CT CT ABD-PELV W/ CM
2 of 5 series · 16 of 46 positions shown, 18 images · IV contrast (Omni 300)
Comparison: CT abdomen pelvis dated 10/11/2017.

CLINICAL DATA: 54-year-old male with right lower quadrant abdominal
pain.

EXAM:
CT ABDOMEN AND PELVIS WITH CONTRAST
TECHNIQUE: Multidetector CT imaging of the abdomen and pelvis was performed
using the standard protocol following bolus administration of
intravenous contrast.
CONTRAST:  100mL OMNIPAQUE IOHEXOL 300 MG/ML  SOLN

[Series 3: a/p w/ 5mm · axial · 0.98mm/px · z∈[-721,-221]mm · 13 of 112 slices shown, 15 images]
[im 6/112  soft-tissue]
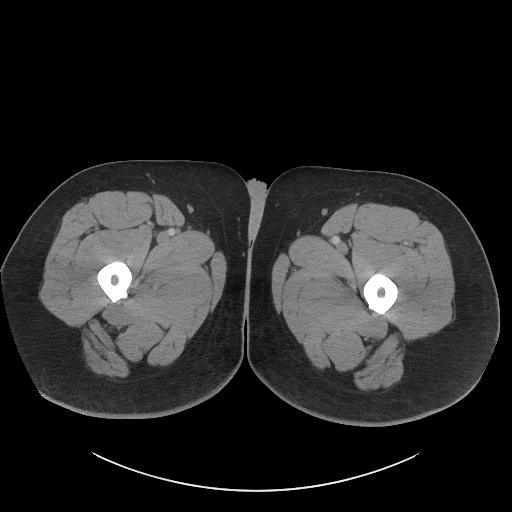
[im 6/112  bone]
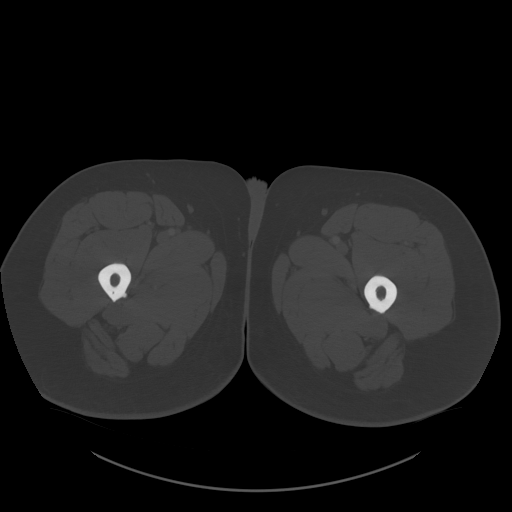
[im 18/112  soft-tissue]
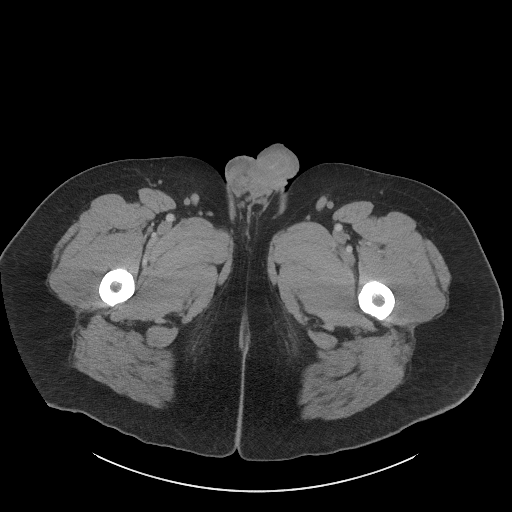
[im 24/112  soft-tissue]
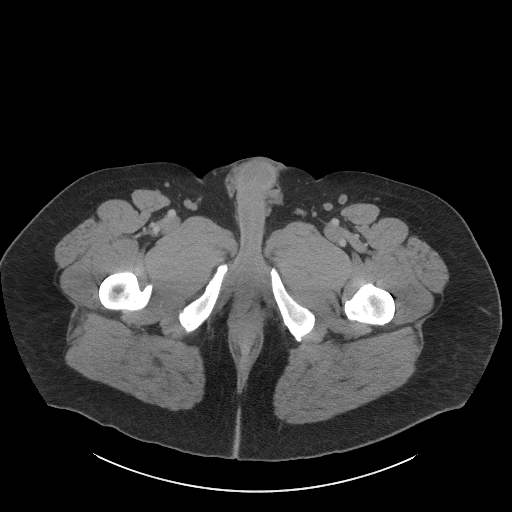
[im 30/112  soft-tissue]
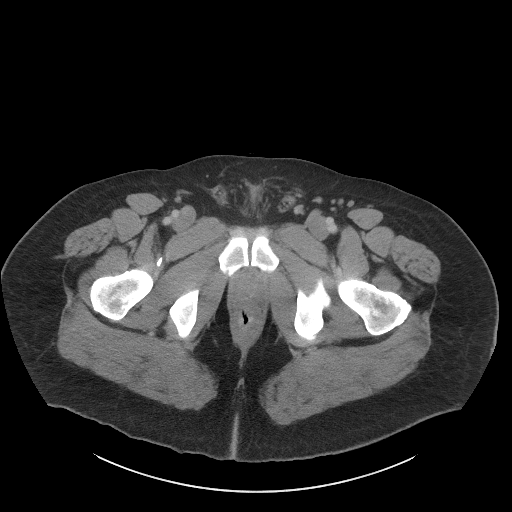
[im 41/112  soft-tissue]
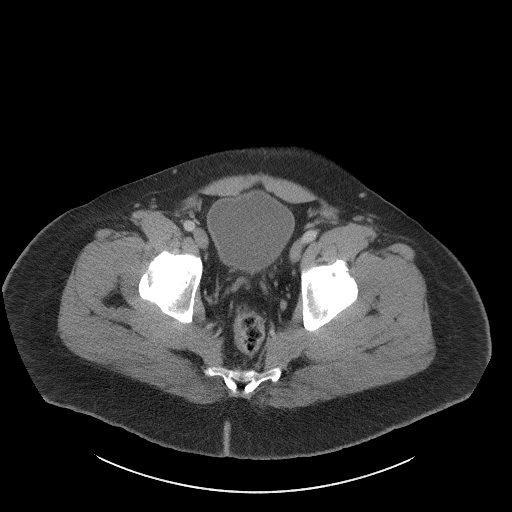
[im 47/112  soft-tissue]
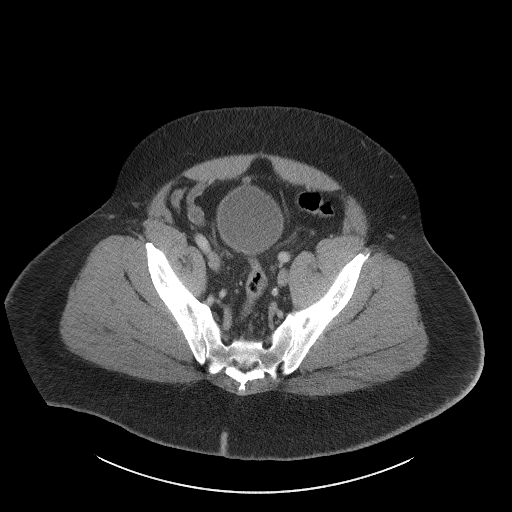
[im 59/112  soft-tissue]
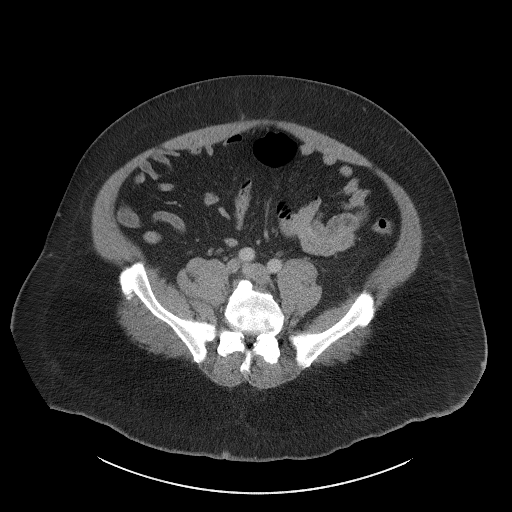
[im 65/112  soft-tissue]
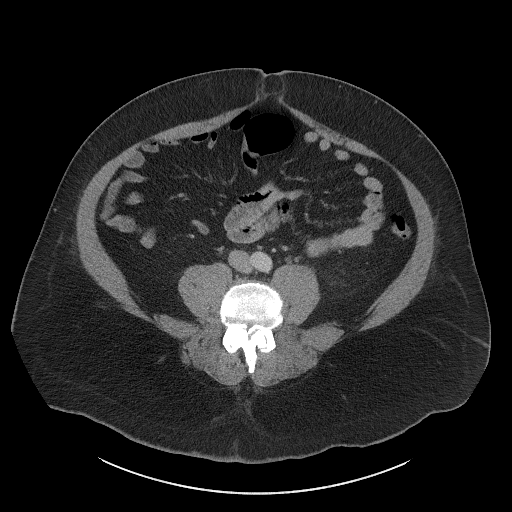
[im 71/112  soft-tissue]
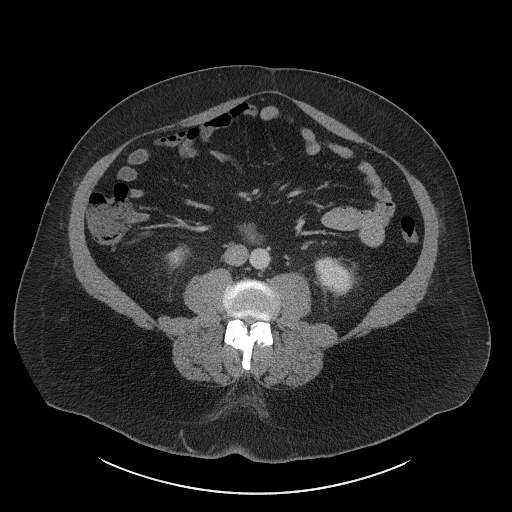
[im 71/112  bone]
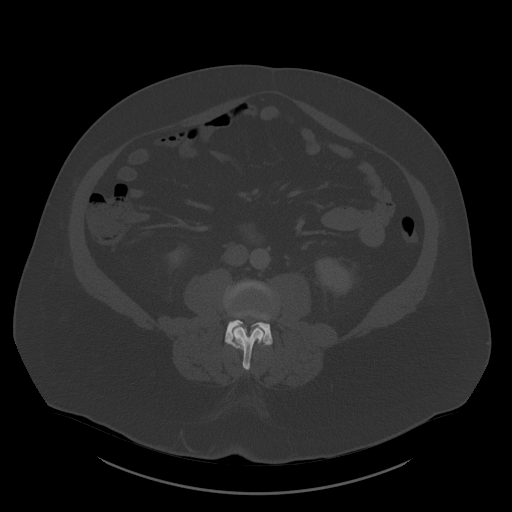
[im 82/112  soft-tissue]
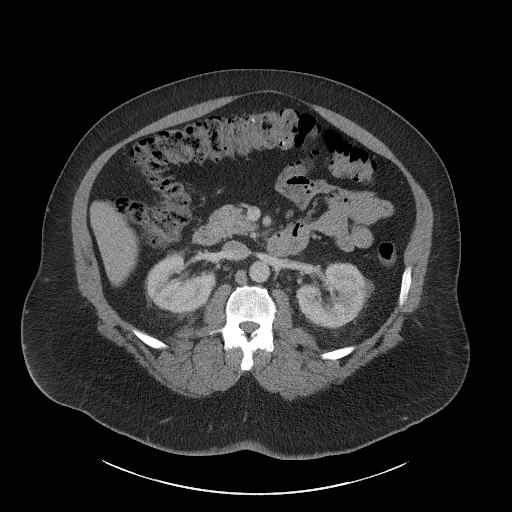
[im 88/112  soft-tissue]
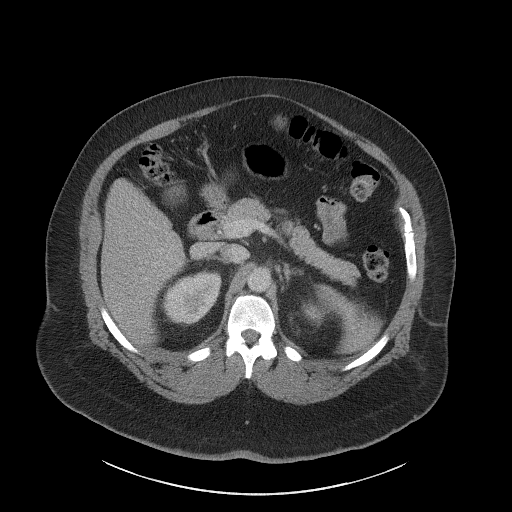
[im 94/112  soft-tissue]
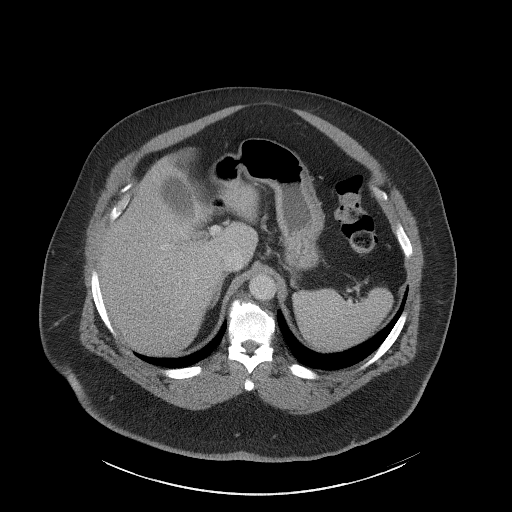
[im 106/112  soft-tissue]
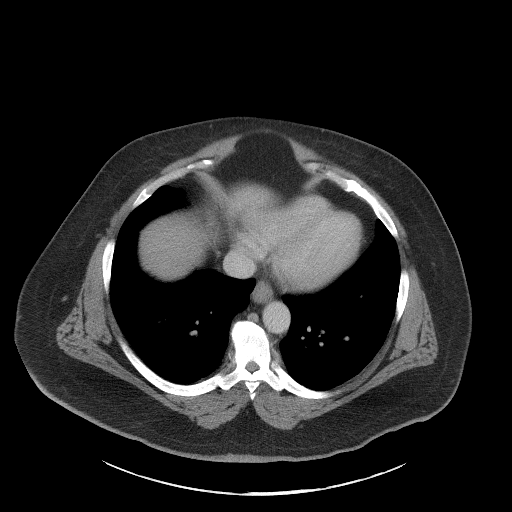

[Series 6: a/p w/ cor · coronal · 1.09mm/px · 3 of 209 slices shown]
[im 70/209  soft-tissue]
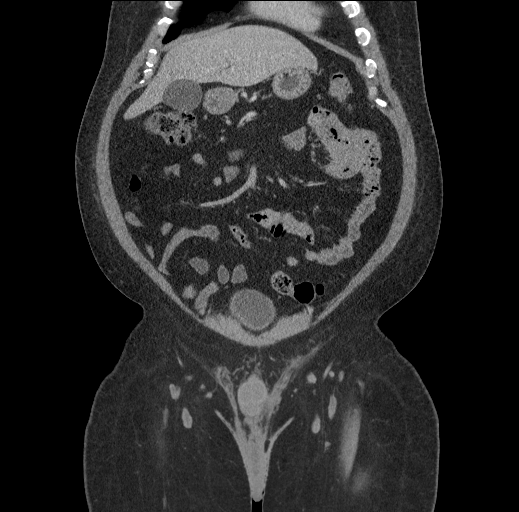
[im 93/209  soft-tissue]
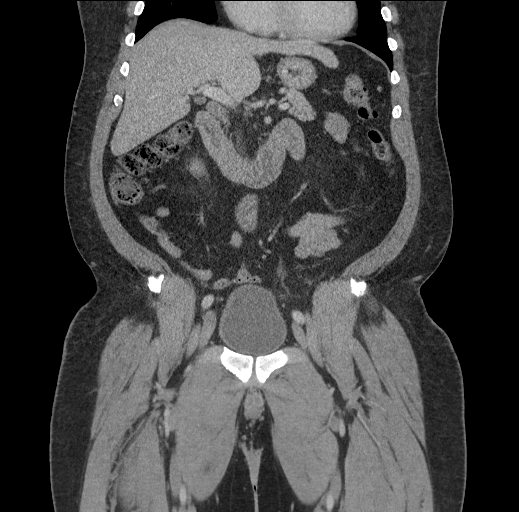
[im 116/209  soft-tissue]
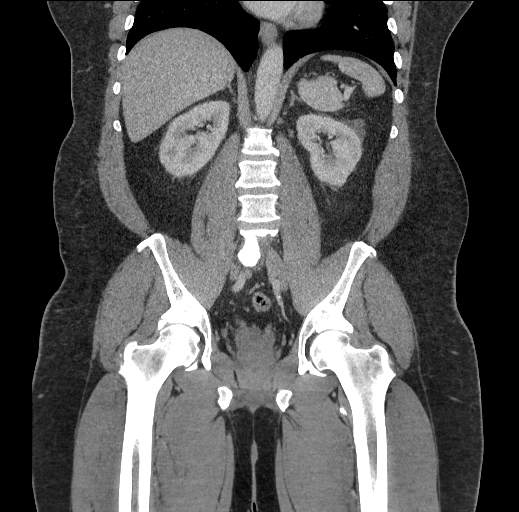

[16 of 46 positions shown; findings below may reference images not displayed]

FINDINGS: Lower chest: The visualized lung bases are clear.

No intra-abdominal free air or free fluid.

Hepatobiliary: Probable fatty infiltration of the liver. No
intrahepatic biliary ductal dilatation. The gallbladder is
unremarkable.

Pancreas: Unremarkable. No pancreatic ductal dilatation or
surrounding inflammatory changes.

Spleen: Normal in size without focal abnormality.

Adrenals/Urinary Tract: The adrenal glands are unremarkable. There
is no hydronephrosis on either side. There is symmetric enhancement
and excretion of contrast by both kidneys. There is bilateral
perinephric stranding as seen previously, nonspecific. Correlation
with urinalysis recommended to exclude UTI. There is a 2 cm fluid
density along the lateral interpolar left kidney which may represent
focal collection of perinephric stranding or an exophytic renal
cyst. Ultrasound may provide better evaluation on a nonemergent
basis. The visualized ureters and urinary bladder appear
unremarkable.

Stomach/Bowel: There is moderate stool throughout the colon. There
is no bowel obstruction or active inflammation. The appendix is
normal.

Vascular/Lymphatic: The abdominal aorta and IVC are unremarkable. No
portal venous gas. There is no adenopathy.

Reproductive: The prostate and seminal vesicles are grossly
unremarkable.

Other: Small fat containing umbilical hernia.

Musculoskeletal: Degenerative changes of the spine. No acute osseous
pathology.
IMPRESSION: 1. Bilateral perinephric stranding as seen previously. Correlation
with urinalysis recommended to exclude UTI.
2. No bowel obstruction. Normal appendix.
